# Patient Record
Sex: Female | Born: 1980
Health system: Southern US, Community
[De-identification: ages and names within clinical notes are randomized; demographics above are authoritative.]

## PROBLEM LIST (undated history)

## (undated) DIAGNOSIS — E282 Polycystic ovarian syndrome: Secondary | ICD-10-CM

## (undated) DIAGNOSIS — F419 Anxiety disorder, unspecified: Secondary | ICD-10-CM

## (undated) DIAGNOSIS — Z720 Tobacco use: Secondary | ICD-10-CM

## (undated) DIAGNOSIS — Z87898 Personal history of other specified conditions: Secondary | ICD-10-CM

## (undated) DIAGNOSIS — F32A Depression, unspecified: Secondary | ICD-10-CM

## (undated) DIAGNOSIS — R569 Unspecified convulsions: Secondary | ICD-10-CM

## (undated) DIAGNOSIS — B354 Tinea corporis: Secondary | ICD-10-CM

## (undated) DIAGNOSIS — F1911 Other psychoactive substance abuse, in remission: Secondary | ICD-10-CM

## (undated) DIAGNOSIS — F329 Major depressive disorder, single episode, unspecified: Secondary | ICD-10-CM

## (undated) HISTORY — PX: DILATION AND CURETTAGE OF UTERUS: SHX78

## (undated) HISTORY — PX: TONSILLECTOMY: SUR1361

---

## 2005-12-19 ENCOUNTER — Ambulatory Visit: Payer: Self-pay | Admitting: Otolaryngology

## 2007-01-07 ENCOUNTER — Emergency Department: Payer: Self-pay | Admitting: Emergency Medicine

## 2007-04-18 ENCOUNTER — Ambulatory Visit: Payer: Self-pay | Admitting: Unknown Physician Specialty

## 2007-04-21 ENCOUNTER — Inpatient Hospital Stay: Payer: Self-pay | Admitting: Unknown Physician Specialty

## 2007-05-19 ENCOUNTER — Ambulatory Visit: Payer: Self-pay | Admitting: Unknown Physician Specialty

## 2007-06-18 ENCOUNTER — Ambulatory Visit: Payer: Self-pay | Admitting: Unknown Physician Specialty

## 2007-07-19 ENCOUNTER — Ambulatory Visit: Payer: Self-pay | Admitting: Unknown Physician Specialty

## 2011-08-31 ENCOUNTER — Ambulatory Visit: Payer: Self-pay | Admitting: Neurology

## 2014-11-25 DIAGNOSIS — L700 Acne vulgaris: Secondary | ICD-10-CM | POA: Insufficient documentation

## 2014-11-25 DIAGNOSIS — F411 Generalized anxiety disorder: Secondary | ICD-10-CM | POA: Insufficient documentation

## 2014-11-25 DIAGNOSIS — F5101 Primary insomnia: Secondary | ICD-10-CM | POA: Insufficient documentation

## 2015-01-22 ENCOUNTER — Observation Stay: Payer: Self-pay | Admitting: Obstetrics & Gynecology

## 2015-01-22 LAB — CBC WITH DIFFERENTIAL/PLATELET
Basophil #: 0.1 10*3/uL (ref 0.0–0.1)
Basophil #: 0.1 10*3/uL (ref 0.0–0.1)
Basophil %: 0.7 %
Basophil %: 0.3 %
Eosinophil #: 0.1 10*3/uL (ref 0.0–0.7)
Eosinophil #: 0.1 10*3/uL (ref 0.0–0.7)
Eosinophil %: 0.3 %
Eosinophil %: 0.8 %
HCT: 42.4 % (ref 35.0–47.0)
HCT: 35.5 % (ref 35.0–47.0)
HGB: 14.1 g/dL (ref 12.0–16.0)
HGB: 11.9 g/dL — ABNORMAL LOW (ref 12.0–16.0)
Lymphocyte #: 1.8 10*3/uL (ref 1.0–3.6)
Lymphocyte #: 3.4 10*3/uL (ref 1.0–3.6)
Lymphocyte %: 9.7 %
Lymphocyte %: 17.8 %
MCH: 30.2 pg (ref 26.0–34.0)
MCH: 30.4 pg (ref 26.0–34.0)
MCHC: 33.4 g/dL (ref 32.0–36.0)
MCHC: 33.6 g/dL (ref 32.0–36.0)
MCV: 90 fL (ref 80–100)
MCV: 90 fL (ref 80–100)
Monocyte #: 0.9 x10 3/mm (ref 0.2–0.9)
Monocyte #: 1.3 x10 3/mm — ABNORMAL HIGH (ref 0.2–0.9)
Monocyte %: 4.9 %
Monocyte %: 6.9 %
Neutrophil #: 16.1 10*3/uL — ABNORMAL HIGH (ref 1.4–6.5)
Neutrophil #: 14.3 10*3/uL — ABNORMAL HIGH (ref 1.4–6.5)
Neutrophil %: 84.4 %
Neutrophil %: 74.2 %
Platelet: 254 10*3/uL (ref 150–440)
Platelet: 223 10*3/uL (ref 150–440)
RBC: 4.69 10*6/uL (ref 3.80–5.20)
RBC: 3.93 10*6/uL (ref 3.80–5.20)
RDW: 12.2 % (ref 11.5–14.5)
RDW: 12.2 % (ref 11.5–14.5)
WBC: 19 10*3/uL — ABNORMAL HIGH (ref 3.6–11.0)
WBC: 19.3 10*3/uL — ABNORMAL HIGH (ref 3.6–11.0)

## 2015-01-22 LAB — URINALYSIS, COMPLETE
Bacteria: NONE SEEN
Bilirubin,UR: NEGATIVE
Glucose,UR: 50 mg/dL (ref 0–75)
Leukocyte Esterase: NEGATIVE
Nitrite: NEGATIVE
Ph: 6 (ref 4.5–8.0)
Protein: NEGATIVE
RBC,UR: 4 /HPF (ref 0–5)
Specific Gravity: 1.019 (ref 1.003–1.030)
Squamous Epithelial: 1
WBC UR: 1 /HPF (ref 0–5)

## 2015-01-22 LAB — LIPASE, BLOOD: Lipase: 80 U/L (ref 73–393)

## 2015-01-22 LAB — COMPREHENSIVE METABOLIC PANEL
Albumin: 3.6 g/dL (ref 3.4–5.0)
Alkaline Phosphatase: 100 U/L (ref 46–116)
Anion Gap: 8 (ref 7–16)
BUN: 8 mg/dL (ref 7–18)
Bilirubin,Total: 1 mg/dL (ref 0.2–1.0)
Calcium, Total: 8.9 mg/dL (ref 8.5–10.1)
Chloride: 101 mmol/L (ref 98–107)
Co2: 28 mmol/L (ref 21–32)
Creatinine: 0.84 mg/dL (ref 0.60–1.30)
EGFR (African American): >60
EGFR (Non-African Amer.): >60
Glucose: 146 mg/dL — ABNORMAL HIGH (ref 65–99)
Osmolality: 275 (ref 275–301)
Potassium: 3.4 mmol/L — ABNORMAL LOW (ref 3.5–5.1)
SGOT(AST): 28 U/L (ref 15–37)
SGPT (ALT): 24 U/L (ref 14–63)
Sodium: 137 mmol/L (ref 136–145)
Total Protein: 7.5 g/dL (ref 6.4–8.2)

## 2015-01-22 LAB — HCG, QUANTITATIVE, PREGNANCY: Beta Hcg, Quant.: 870 m[IU]/mL — ABNORMAL HIGH

## 2015-01-23 LAB — CBC WITH DIFFERENTIAL/PLATELET
Basophil #: 0.1 10*3/uL (ref 0.0–0.1)
Basophil %: 0.4 %
Eosinophil #: 0.3 10*3/uL (ref 0.0–0.7)
Eosinophil %: 1.6 %
HCT: 31.1 % — ABNORMAL LOW (ref 35.0–47.0)
HGB: 10.8 g/dL — ABNORMAL LOW (ref 12.0–16.0)
Lymphocyte #: 3.9 10*3/uL — ABNORMAL HIGH (ref 1.0–3.6)
Lymphocyte %: 22.2 %
MCH: 31.6 pg (ref 26.0–34.0)
MCHC: 34.6 g/dL (ref 32.0–36.0)
MCV: 91 fL (ref 80–100)
Monocyte #: 1.1 x10 3/mm — ABNORMAL HIGH (ref 0.2–0.9)
Monocyte %: 6.5 %
Neutrophil #: 12.1 10*3/uL — ABNORMAL HIGH (ref 1.4–6.5)
Neutrophil %: 69.3 %
Platelet: 193 10*3/uL (ref 150–440)
RBC: 3.41 10*6/uL — ABNORMAL LOW (ref 3.80–5.20)
RDW: 12.1 % (ref 11.5–14.5)
WBC: 17.5 10*3/uL — ABNORMAL HIGH (ref 3.6–11.0)

## 2015-04-12 LAB — SURGICAL PATHOLOGY

## 2015-04-18 NOTE — Op Note (Signed)
PATIENT NAME:  Darrel ReachRAY, Kamya MR#:  [id number] DATE OF BIRTH:  02-10-1981  DATE OF PROCEDURE:  01/23/2015  PREOPERATIVE DIAGNOSIS: Incomplete abortion.   POSTOPERATIVE DIAGNOSIS: Incomplete abortion.   PROCEDURE: Suction dilatation and curettage.   SURGEON: Dierdre Searles. Paul Harris, M.D.   ANESTHESIA: General.   ESTIMATED BLOOD LOSS: Minimal.   COMPLICATIONS: None.   FINDINGS AND SPECIMENS: Products of conception.   DISPOSITION: To recovery room stable.   TECHNIQUE: The patient is prepped and draped in the usual sterile fashion after adequate anesthesia is obtained in the dorsal lithotomy position. Bladder is drained with a Robinson catheter. Speculum is placed and the anterior lip of the cervix is grasped with a tenaculum. Using a grasping forceps, some tissue is removed from the cervix and lower uterine segment consistent with history and ultrasound findings. Aspiration of products of conception using an 8 mm rigid curette is performed under suction. A copious amount of tissue was obtained, both through suction as well as banjo curetting. A gentle curettage is performed. Repeat aspiration is performed. The patient is deemed to have complete curettage of her intrauterine cavity. The tenaculum is removed with hemostasis noted. The patient goes to recovery room in stable condition. All sponge, instrument, and needle counts are correct    ____________________________ R. Annamarie MajorPaul Harris, MD rph:at D: 01/23/2015 14:21:19 ET T: 01/23/2015 16:16:52 ET JOB#: [id number]  cc: Dierdre Searles. Paul Harris, MD, <Dictator> Nadara MustardOBERT P HARRIS MD ELECTRONICALLY SIGNED 01/28/2015 2:45

## 2015-12-19 NOTE — L&D Delivery Note (Signed)
Delivery Note At 6:02 AM a pre-viable apparent female was delivered via Vaginal, Spontaneous Delivery (Presentation: cephalic).  APGAR: Not assigned.   Placenta status: Spontaneous, intact.  Cord: 3 vessels.  with the following complications: None.   Anesthesia:  Epidural Episiotomy: None Lacerations: None Suture Repair: n/a Est. Blood Loss (mL): 300  Called to see patient.  Mom pushed to deliver a pre-viable apparent female infant.  The head followed by shoulders, which delivered without difficulty, and the rest of the body.  Cord clamped and cut.  No cord blood obtained.  Placenta delivered spontaneously, intact, with a 3-vessel cord. No cervical, vaginal, or perineal laceration noted.  All counts correct.  Hemostasis obtained with IV pitocin and fundal massage. EBL 300 mL.    Conard NovakJackson, Caly Pellum D 09/15/2016, 6:26 AM

## 2016-03-18 ENCOUNTER — Encounter: Payer: Self-pay | Admitting: *Deleted

## 2016-03-18 ENCOUNTER — Emergency Department: Payer: 59

## 2016-03-18 ENCOUNTER — Observation Stay
Admission: EM | Admit: 2016-03-18 | Discharge: 2016-03-19 | Disposition: A | Discharge status: Home / Self Care | Payer: 59 | Attending: Obstetrics and Gynecology | Admitting: Obstetrics and Gynecology

## 2016-03-18 DIAGNOSIS — O209 Hemorrhage in early pregnancy, unspecified: Secondary | ICD-10-CM | POA: Insufficient documentation

## 2016-03-18 DIAGNOSIS — G40909 Epilepsy, unspecified, not intractable, without status epilepticus: Secondary | ICD-10-CM | POA: Insufficient documentation

## 2016-03-18 DIAGNOSIS — Z3A08 8 weeks gestation of pregnancy: Secondary | ICD-10-CM | POA: Insufficient documentation

## 2016-03-18 DIAGNOSIS — O034 Incomplete spontaneous abortion without complication: Principal | ICD-10-CM | POA: Insufficient documentation

## 2016-03-18 DIAGNOSIS — Z87891 Personal history of nicotine dependence: Secondary | ICD-10-CM | POA: Insufficient documentation

## 2016-03-18 DIAGNOSIS — F419 Anxiety disorder, unspecified: Secondary | ICD-10-CM | POA: Insufficient documentation

## 2016-03-18 DIAGNOSIS — F329 Major depressive disorder, single episode, unspecified: Secondary | ICD-10-CM | POA: Insufficient documentation

## 2016-03-18 DIAGNOSIS — O039 Complete or unspecified spontaneous abortion without complication: Secondary | ICD-10-CM

## 2016-03-18 HISTORY — DX: Polycystic ovarian syndrome: E28.2

## 2016-03-18 HISTORY — DX: Depression, unspecified: F32.A

## 2016-03-18 HISTORY — DX: Major depressive disorder, single episode, unspecified: F32.9

## 2016-03-18 HISTORY — DX: Tobacco use: Z72.0

## 2016-03-18 HISTORY — DX: Personal history of other specified conditions: Z87.898

## 2016-03-18 HISTORY — DX: Other psychoactive substance abuse, in remission: F19.11

## 2016-03-18 HISTORY — DX: Anxiety disorder, unspecified: F41.9

## 2016-03-18 LAB — CBC
HCT: 40.2 % (ref 35.0–47.0)
Hemoglobin: 13.6 g/dL (ref 12.0–16.0)
MCH: 30.7 pg (ref 26.0–34.0)
MCHC: 33.9 g/dL (ref 32.0–36.0)
MCV: 90.5 fL (ref 80.0–100.0)
Platelets: 258 10*3/uL (ref 150–440)
RBC: 4.44 MIL/uL (ref 3.80–5.20)
RDW: 12.9 % (ref 11.5–14.5)
WBC: 20.7 10*3/uL — ABNORMAL HIGH (ref 3.6–11.0)

## 2016-03-18 LAB — TYPE AND SCREEN
ABO/RH(D): O POS
Antibody Screen: NEGATIVE

## 2016-03-18 LAB — ABO/RH: ABO/RH(D): O POS

## 2016-03-18 LAB — HCG, QUANTITATIVE, PREGNANCY: hCG, Beta Chain, Quant, S: 1279 m[IU]/mL — ABNORMAL HIGH (ref <5)

## 2016-03-18 MED ORDER — SODIUM CHLORIDE 0.9 % IV SOLN
1000.0000 mL | Freq: Once | INTRAVENOUS | Status: AC
  Administered 2016-03-19: 1000 mL via INTRAVENOUS

## 2016-03-18 MED ORDER — ONDANSETRON HCL 4 MG/2ML IJ SOLN
4.0000 mg | Freq: Once | INTRAMUSCULAR | Status: AC
  Administered 2016-03-18: 4 mg via INTRAVENOUS
  Filled 2016-03-18: qty 2

## 2016-03-18 MED ORDER — MORPHINE SULFATE (PF) 4 MG/ML IV SOLN
4.0000 mg | Freq: Once | INTRAVENOUS | Status: AC
  Administered 2016-03-18: 4 mg via INTRAVENOUS
  Filled 2016-03-18: qty 1

## 2016-03-18 NOTE — ED Notes (Signed)
[facility]

## 2016-03-18 NOTE — ED Notes (Signed)
Provided pt with ice water per EDP approval and pt request.

## 2016-03-18 NOTE — ED Notes (Signed)
Pt states vaginal bleeding and cramping for 5 hours, states nausea and lightheaded, states she is unsure if she is pregnant, states she is passing clots and tissues, states hx of miscarriages

## 2016-03-18 NOTE — ED Provider Notes (Signed)
[facility] Emergency Department Provider Note  ____________________________________________    I have reviewed the triage vital signs and the nursing notes.   HISTORY  Chief Complaint Vaginal Bleeding    HPI Darrel Reachshley Schoppe is a 35 y.o. female who presents with painful pelvic cramping and vaginal bleeding which reportedly started today. She does not know her  last menstrual period as she has irregular periods. She denies dizziness. No nausea or vomiting. She does not know she is pregnant but she suspects she might be. She does have a history of a miscarriage in the past. She does report passing clots.     History reviewed. No pertinent past medical history.  There are no active problems to display for this patient.   No past surgical history on file.  No current outpatient prescriptions on file.  Allergies Sulfa antibiotics  History reviewed. No pertinent family history.  Social History Social History  Substance Use Topics  . Smoking status: None  . Smokeless tobacco: None  . Alcohol Use: None  Mother reports patient has a history of drug abuse/addiction Nonsmoker  Review of Systems  Constitutional: Negative for dizziness Eyes: Negative for redness ENT: Negative for sore throat Cardiovascular: Negative for palpitations Respiratory: Negative for shortness of breath. Gastrointestinal: Negative for abdominal pain Genitourinary: Negative for dysuria. Vaginal bleeding as above Musculoskeletal: Negative for back pain. Skin: Negative for rash. Neurological: Negative for focal weakness Psychiatric: no anxiety    ____________________________________________   PHYSICAL EXAM:  VITAL SIGNS: ED Triage Vitals  Enc Vitals Group     BP 03/18/16 1744 103/57 mmHg     Pulse Rate 03/18/16 1744 59     Resp 03/18/16 1744 18     Temp 03/18/16 1744 98.3 F (36.8 C)     Temp Source 03/18/16 1744 Oral     SpO2 03/18/16 1744 100 %     Weight 03/18/16  1744 160 lb (72.576 kg)     Height 03/18/16 1744 5\' 5"  (1.651 m)     Head Cir --      Peak Flow --      Pain Score 03/18/16 1744 10     Pain Loc --      Pain Edu? --      Excl. in GC? --    Constitutional: Alert and oriented. No acute distress but anxious Eyes: Conjunctivae are normal. No erythema or injection ENT   Head: Normocephalic and atraumatic.   Mouth/Throat: Mucous membranes are moist. Cardiovascular: Normal rate, regular rhythm. Normal and symmetric distal pulses are present in the upper extremities. No murmurs or rubs  Respiratory: Normal respiratory effort without tachypnea nor retractions. Breath sounds are clear and equal bilaterally.  Gastrointestinal: Soft and non-tender in all quadrants. No distention. There is no CVA tenderness. Genitourinary: deferred Musculoskeletal: Nontender with normal range of motion in all extremities.  Neurologic:  Normal speech and language. No gross focal neurologic deficits are appreciated. Skin:  Skin is warm, dry and intact. No rash noted.   ____________________________________________    LABS (pertinent positives/negatives)  Labs Reviewed  HCG, QUANTITATIVE, PREGNANCY - Abnormal; Notable for the following:    hCG, Beta Chain, Quant, S 1279 (*)    All other components within normal limits  CBC - Abnormal; Notable for the following:    WBC 20.7 (*)    All other components within normal limits  ABO/RH  TYPE AND SCREEN    ____________________________________________   EKG  None  ____________________________________________    RADIOLOGY  Ultrasound pending  ____________________________________________   PROCEDURES  Procedure(s) performed: none  Critical Care performed: none  ____________________________________________   INITIAL IMPRESSION / ASSESSMENT AND PLAN / ED COURSE  Pertinent labs & imaging results that were available during my care of the patient were reviewed by me and considered in my  medical decision making (see chart for details).  Patient with beta 1500, she is in considerable pain we'll give IV morphine and Zofran. Ultrasound pending, hemoglobin normal, pulse normal   Patient refused to allow ultrasound tech to do transvaginal ultrasound. This was not communicated to me. I discussed with the patient how important it is to get accurate study and she agrees to do transvaginal study now.  Tech informed me that second ultrasound was also limited. She continues to complain of severe pain and is vomiting  I paged Dr. Vergie Living of GYN to discuss the case with him. He has requested a repeat CBC, normal saline and he will see the patient in the ED _______________________  _____________________   FINAL CLINICAL IMPRESSION(S) / ED DIAGNOSES  Final diagnoses:  Miscarriage          Jene Every, MD 03/18/16 2345

## 2016-03-18 NOTE — ED Notes (Signed)
Patient transported to Ultrasound 

## 2016-03-18 NOTE — ED Notes (Signed)
RN called into patients room, Patient up and in restroom trying to give urine sample. Patient passed large amount of blood while in the restroom. Patient placed backed in the bed. Patient reports some lightheadedness

## 2016-03-18 NOTE — ED Notes (Signed)
Pt c/o pain.  EDP notified.  Wants to wait for results of US.  Patient and patient's mother notified.

## 2016-03-18 NOTE — ED Notes (Signed)
Provided patient pads and introduced self to patient and mom at bedside.  No other needs at this time.

## 2016-03-18 NOTE — ED Notes (Addendum)
Patient states that she had had vaginal bleeding and cramping for about 5 hours. Patient is unsure of her LMP. Patient states that she had used 4-5 pads since the bleeding started. Patient states that she had also been feeling nauseated for the past 2 hours.

## 2016-03-19 ENCOUNTER — Observation Stay: Payer: 59 | Admitting: Registered Nurse

## 2016-03-19 ENCOUNTER — Encounter
Admission: EM | Disposition: A | Discharge status: Home / Self Care | Payer: Self-pay | Source: Home / Self Care | Attending: Emergency Medicine

## 2016-03-19 ENCOUNTER — Encounter: Payer: Self-pay | Admitting: Obstetrics and Gynecology

## 2016-03-19 DIAGNOSIS — O034 Incomplete spontaneous abortion without complication: Secondary | ICD-10-CM | POA: Diagnosis present

## 2016-03-19 HISTORY — PX: DILATION AND CURETTAGE OF UTERUS: SHX78

## 2016-03-19 LAB — URINE DRUG SCREEN, QUALITATIVE (ARMC ONLY)
Amphetamines, Ur Screen: NOT DETECTED
Barbiturates, Ur Screen: NOT DETECTED
Benzodiazepine, Ur Scrn: NOT DETECTED
Cannabinoid 50 Ng, Ur ~~LOC~~: NOT DETECTED
Cocaine Metabolite,Ur ~~LOC~~: NOT DETECTED
MDMA (Ecstasy)Ur Screen: NOT DETECTED
Methadone Scn, Ur: NOT DETECTED
Opiate, Ur Screen: POSITIVE — AB
Phencyclidine (PCP) Ur S: NOT DETECTED
Tricyclic, Ur Screen: NOT DETECTED

## 2016-03-19 LAB — CBC
HCT: 35.4 % (ref 35.0–47.0)
Hemoglobin: 12 g/dL (ref 12.0–16.0)
MCH: 30.8 pg (ref 26.0–34.0)
MCHC: 33.8 g/dL (ref 32.0–36.0)
MCV: 91.3 fL (ref 80.0–100.0)
Platelets: 248 10*3/uL (ref 150–440)
RBC: 3.88 MIL/uL (ref 3.80–5.20)
RDW: 13 % (ref 11.5–14.5)
WBC: 24.4 10*3/uL — ABNORMAL HIGH (ref 3.6–11.0)

## 2016-03-19 LAB — CHLAMYDIA/NGC RT PCR (ARMC ONLY)
Chlamydia Tr: NOT DETECTED
N gonorrhoeae: NOT DETECTED

## 2016-03-19 SURGERY — DILATION AND CURETTAGE
Anesthesia: General | Wound class: Clean Contaminated

## 2016-03-19 SURGERY — DILATION AND EVACUATION, UTERUS
Anesthesia: Choice

## 2016-03-19 MED ORDER — METHYLERGONOVINE MALEATE 0.2 MG/ML IJ SOLN
0.2000 mg | Freq: Once | INTRAMUSCULAR | Status: AC
Start: 2016-03-19 — End: 2016-03-19
  Administered 2016-03-19: 0.2 mg via INTRAMUSCULAR

## 2016-03-19 MED ORDER — METHYLERGONOVINE MALEATE 0.2 MG/ML IJ SOLN
INTRAMUSCULAR | Status: AC
  Filled 2016-03-19: qty 1

## 2016-03-19 MED ORDER — ONDANSETRON HCL 4 MG/2ML IJ SOLN: 4.0000 mg | Freq: Once | INTRAMUSCULAR | Status: DC | PRN

## 2016-03-19 MED ORDER — MORPHINE SULFATE (PF) 4 MG/ML IV SOLN
4.0000 mg | Freq: Once | INTRAVENOUS | Status: AC
  Administered 2016-03-19: 4 mg via INTRAVENOUS
  Filled 2016-03-19: qty 1

## 2016-03-19 MED ORDER — HYDROMORPHONE HCL 1 MG/ML IJ SOLN: 1.0000 mg | INTRAMUSCULAR | Status: DC | PRN

## 2016-03-19 MED ORDER — IBUPROFEN 200 MG PO TABS: 600.0000 mg | ORAL_TABLET | Freq: Four times a day (QID) | ORAL | Status: DC | PRN

## 2016-03-19 MED ORDER — FENTANYL CITRATE (PF) 100 MCG/2ML IJ SOLN
INTRAMUSCULAR | Status: DC | PRN
  Administered 2016-03-19: 100 ug via INTRAVENOUS

## 2016-03-19 MED ORDER — SILVER NITRATE-POT NITRATE 75-25 % EX MISC
CUTANEOUS | Status: DC | PRN
  Administered 2016-03-19: 4 via TOPICAL

## 2016-03-19 MED ORDER — FENTANYL CITRATE (PF) 100 MCG/2ML IJ SOLN: 25.0000 ug | INTRAMUSCULAR | Status: DC | PRN

## 2016-03-19 MED ORDER — METOCLOPRAMIDE HCL 5 MG/ML IJ SOLN: 10.0000 mg | Freq: Four times a day (QID) | INTRAMUSCULAR | Status: DC | PRN

## 2016-03-19 MED ORDER — METHYLERGONOVINE MALEATE 0.2 MG/ML IJ SOLN: 0.2000 mg | Freq: Once | INTRAMUSCULAR | Status: DC

## 2016-03-19 MED ORDER — ONDANSETRON HCL 4 MG/2ML IJ SOLN
4.0000 mg | Freq: Once | INTRAMUSCULAR | Status: AC
  Administered 2016-03-19: 4 mg via INTRAVENOUS
  Filled 2016-03-19: qty 2

## 2016-03-19 MED ORDER — LACTATED RINGERS IV SOLN
INTRAVENOUS | Status: DC | PRN
  Administered 2016-03-19: 02:00:00 via INTRAVENOUS

## 2016-03-19 MED ORDER — SODIUM CHLORIDE 0.9 % IV SOLN: INTRAVENOUS | Status: DC

## 2016-03-19 MED ORDER — DEXTROSE 5 % IV SOLN
100.0000 mg | Freq: Once | INTRAVENOUS | Status: DC
  Filled 2016-03-19: qty 100

## 2016-03-19 MED ORDER — PROPOFOL 10 MG/ML IV BOLUS
INTRAVENOUS | Status: DC | PRN
  Administered 2016-03-19: 160 mg via INTRAVENOUS

## 2016-03-19 MED ORDER — METHYLERGONOVINE MALEATE 0.2 MG PO TABS: 0.2000 mg | ORAL_TABLET | Freq: Four times a day (QID) | ORAL | Status: DC

## 2016-03-19 MED ORDER — SUCCINYLCHOLINE CHLORIDE 20 MG/ML IJ SOLN
INTRAMUSCULAR | Status: DC | PRN
  Administered 2016-03-19: 100 mg via INTRAVENOUS

## 2016-03-19 MED ORDER — HYDROMORPHONE HCL 1 MG/ML IJ SOLN: 0.2500 mg | INTRAMUSCULAR | Status: DC | PRN

## 2016-03-19 MED ORDER — ACETAMINOPHEN-CODEINE #3 300-30 MG PO TABS: 1.0000 | ORAL_TABLET | Freq: Three times a day (TID) | ORAL | Status: DC | PRN

## 2016-03-19 SURGICAL SUPPLY — 17 items
CATH ROBINSON RED A/P 16FR (CATHETERS) ×2 IMPLANT
GLOVE BIO SURGEON STRL SZ7 (GLOVE) ×2 IMPLANT
GLOVE INDICATOR 7.5 STRL GRN (GLOVE) ×2 IMPLANT
GOWN STRL REUS W/ TWL LRG LVL3 (GOWN DISPOSABLE) ×1 IMPLANT
GOWN STRL REUS W/ TWL XL LVL3 (GOWN DISPOSABLE) ×1 IMPLANT
GOWN STRL REUS W/TWL LRG LVL3 (GOWN DISPOSABLE) ×1
GOWN STRL REUS W/TWL XL LVL3 (GOWN DISPOSABLE) ×1
KIT RM TURNOVER CYSTO AR (KITS) ×2 IMPLANT
NEEDLE SPNL 22GX3.5 QUINCKE BK (NEEDLE) IMPLANT
PACK DNC HYST (MISCELLANEOUS) ×2 IMPLANT
PAD OB MATERNITY 4.3X12.25 (PERSONAL CARE ITEMS) ×2 IMPLANT
PAD PREP 24X41 OB/GYN DISP (PERSONAL CARE ITEMS) ×2 IMPLANT
SET BERKELEY SUCTION TUBING (SUCTIONS) ×2 IMPLANT
SYR CONTROL 10ML (SYRINGE) IMPLANT
TOWEL OR 17X26 4PK STRL BLUE (TOWEL DISPOSABLE) ×2 IMPLANT
VACURETTE 10 RIGID CVD (CANNULA) ×2 IMPLANT
VACURETTE 8 RIGID CVD (CANNULA) ×2 IMPLANT

## 2016-03-19 NOTE — Consult Note (Addendum)
Obstetrics & Gynecology Consultation Note  Date of Consultation: 03/19/2016   Requesting Provider: [facility];  Dr. Cyril LoosenKinner  [facility] Primary Care Provider: Leighton RuffKLINE,Ashley  Reason for Consultation: VB, ?SAB in progress  History of Present Illness: [name] is a 35 y.o. G2P0010 (LMP: unknown, ?Jan 2017), with the above CC. PMHx is significant for h/o similar situation in 01/2015 and need for D&C, remote h/o seizure disorder, tobacco abuse, BMI 27, irregular periods (?PCOS).  Patient started having slight VB and cramping yesterday that has intensified today so she came in for evaluation. Pt unsure of how much but passing large clots. Patient with long history of irregular periods. Last OCP use was in December 2016 and ?period in January of this year. She didn't know she was pregnant until she came to the [facility] for evaluation. No fevers, chills, chest pain, SOB. +nausea and vomiting. Last PO was breakfast yesterday.    ROS: A 12-point review of systems was performed and negative, except as stated in the above HPI.  [facility] History: As per HPI. OB History    Gravida Para Term Preterm AB TAB SAB Ectopic Multiple Living   2    1  1          Obstetric Comments   01/2015: SAB, D&C after failed medical management. Dr Ashley [facility], Ashley [facility]     History of abnormal pap smears: Yes. Last pap smear 2013 History of STIs: No She is currently using nothing for contraception.    Past Medical History: Past Medical History  Diagnosis Date  . History of seizures   . Tobacco abuse   . Polycystic disease, ovaries   . History of substance abuse     Past Surgical History: Past Surgical History  Procedure Laterality Date  . Tonsillectomy    . Dilation and curettage of uterus      Family History:  History reviewed. No pertinent family history.  Social History:  Social History   Social History  . Marital Status: Single    Spouse Name: N/A  . Number of Children: N/A  . Years of  Education: N/A   Occupational History  . Not on file.   Social History Main Topics  . Smoking status: Not on file  . Smokeless tobacco: Not on file  . Alcohol Use: Not on file  . Drug Use: Not on file  . Sexual Activity: Not on file   Other Topics Concern  . Not on file   Social History Narrative  She smokes about a pack q 2-3d  Allergy: Allergies  Allergen Reactions  . Sulfa Antibiotics Hives  . Paroxetine Hcl     Other reaction(s): Other (See Comments)    Current Outpatient Medications:  No current facility-administered medications for this encounter.   Current Outpatient Prescriptions  Medication Sig Dispense Refill  . buPROPion (WELLBUTRIN XL) 150 MG 24 hr tablet Take 150 mg by mouth every morning.  5  . traZODone (DESYREL) 50 MG tablet Take 50 mg by mouth at bedtime as needed. For sleep.  5     Physical Exam: Filed Vitals:   03/18/16 2100 03/18/16 2130 03/18/16 2200 03/19/16 0007  BP: 91/54 99/67 105/62 114/71  Pulse: 51 47 65 56  Temp:      TempSrc:      Resp: 18  18 18   Height:      Weight:      SpO2: 97% 98% 97% 99%   Repeat temp just know was 98  Current Vital Signs 24h Vital Sign Ranges  T 98.3 F (36.8 C) Temp  Avg: 98.3 F (36.8 C)  Min: 98.3 F (36.8 C)  Max: 98.3 F (36.8 C)  BP 114/71 mmHg BP  Min: 91/54  Max: 117/62  HR (!) 56 Pulse  Avg: 58  Min: 47  Max: 65  RR 18 Resp  Avg: 18.7  Min: 18  Max: 22  SaO2 99 % Not Delivered SpO2  Avg: 98.7 %  Min: 97 %  Max: 100 %       24 Hour I/O Current Shift I/O  Time Ins Outs       Body mass index is 26.63 kg/(m^2). General appearance: Well nourished, well developed female in no acute distress.  Neck:  Supple, normal appearance, and no thyromegaly  Cardiovascular:Normal s1 and s2, HR in the 60s Respiratory:  Clear to auscultation bilateral. Normal respiratory effort Abdomen: +BS, soft, nttp, nd.  Neuro/Psych:  Normal mood and affect.  Skin:  Warm and dry.  Lymphatic:  No inguinal  lymphadenopathy.   Pelvic exam: deferred. No gross VB on pad of chus  Laboratory: Beta HCG: 1279 @ 1745  Recent Labs Lab 03/18/16 1746 03/18/16 2356  WBC 20.7* 24.4*  HGB 13.6 12.0  HCT 40.2 35.4  PLT 258 248   Recent Labs Lab 03/18/16 1746  ABORH O POS  O POS    Imaging:  EXAM: TRANSVAGINAL OB ULTRASOUND  TECHNIQUE: Transvaginal ultrasound was performed for complete evaluation of the gestation as well as the maternal uterus, adnexal regions, and pelvic cul-de-sac.  COMPARISON: Pelvic ultrasound performed earlier today at 7:29 p.m.  FINDINGS: Intrauterine gestational sac: Irregular gestational sac noted, extending inferiorly along the lower uterine segment.  Yolk sac: No  Embryo: Yes  Cardiac Activity: The patient refused the remainder of the exam.  CRL: 1.85 cm 8 w 3 d [place] EDC: 10/25/2016  Subchorionic hemorrhage: None visualized.  Maternal uterus/adnexae: The patient refused the remainder of the exam.  IMPRESSION: There is a diffusely irregular gestational sac extending inferiorly along the lower uterine segment. The embryo has a crown-rump length of 1.9 cm, corresponding to a gestational age of [redacted] weeks 3 days. The embryo's cardiac activity is not assessed, as the patient refused the remainder of the exam.  However, the location of the gestational sac is suspicious for spontaneous abortion in progress. On correlation with the transabdominal ultrasound performed earlier this evening, the location of the gestational sac is relatively stable since that study.   Electronically Signed  By: [facility] M.D.  On: 03/18/2016 23:27   CLINICAL DATA: Severe cramping and bleeding for 1 day. Patient unsure of last menstrual. Beta HCG 1,279.  EXAM: OBSTETRIC <14 WK ULTRASOUND  TECHNIQUE: Transabdominal ultrasound was performed for evaluation of the gestation as well as the maternal uterus and adnexal  regions.  COMPARISON: 01/23/2015  FINDINGS: Intrauterine gestational sac: Small volume endometrial fluid, which could represent an injury uterine gestational sac. Suboptimally evaluated secondary to the transabdominal technique. Uterine heterogeneity.  Yolk sac: Absent  Embryo: Absent  Cardiac Activity: Absent  Maternal uterus/adnexae: Heterogeneity throughout the endometrium.  Right ovary measures 2.7 x 3.2 x 2.0 cm and demonstrates a 1.4 cm hypoechoic lesion within, possibly a corpus luteal cyst.  Left ovary is normal, measuring 3.4 x 1.9 x 1.8 cm.  No significant free fluid.  Suboptimal exam, secondary to patient recent voiding and limited mobility secondary to pain. The patient refused transvaginal scanning.  IMPRESSION: 1. Limited exam, as detailed  above. The patient refused transvaginal scanning. 2. Small right ovarian hypoechoic lesion is likely a corpus luteal cyst. 3. Heterogeneity within the endometrium with small amount of endometrial fluid. This could represent an early intrauterine gestational sac, but is suboptimally characterized. Consider short term follow-up with beta HCG and possible ultrasound (ideally with transvaginal component).   Electronically Signed  By: Jeronimo Greaves M.D.  On: 03/18/2016 20:37    Bedside TAUS done and ES 19mm, no flow and looks like blood products but possible rPOCs  Assessment: pt stable  Plan: Given the amount of pain she's in, I told her that I recommend D&C. R/b/a d/w pt and she is amenable to this. WBC elevated but no peritoneal s/s, negative VS and negative exam with patient just endorsing cramping. Minimal concern for septic AB since she looks so well but given picture, another reason to proceed to OR. Can proceed when OR is ready. Pt and mom told that if everything goes well that she should be fine for d/c to home later after the procedure. UDS ordered. Of note, in Care Everywhere it states that  she's had a BTL; the patient confirms that she has NOT had a BTL. Given most c/w IUP and SAB in progress, no need to call pathology in for frozen and can follow up final pathology and trend betas if villi aren't seen.   Total time taking care of the patient was 30 minutes, with greater than 50% of the time spent in face to face interaction with the patient.  Cornelia Copa MD [facility] Pager [phone]

## 2016-03-19 NOTE — Anesthesia Procedure Notes (Signed)
Procedure Name: Intubation Date/Time: 03/19/2016 2:00 AM Performed by: [id number], Knute Mazzuca Pre-anesthesia Checklist: Emergency Drugs available, Suction available, Timeout performed, Patient being monitored and Patient identified Patient Re-evaluated:Patient Re-evaluated prior to inductionOxygen Delivery Method: Circle system utilized Preoxygenation: Pre-oxygenation with 100% oxygen Intubation Type: IV induction and Rapid sequence Number of attempts: 1 Placement Confirmation: breath sounds checked- equal and bilateral,  CO2 detector,  positive ETCO2 and ETT inserted through vocal cords under direct vision Secured at: 21 cm Tube secured with: Tape

## 2016-03-19 NOTE — Anesthesia Preprocedure Evaluation (Addendum)
Anesthesia Evaluation  Patient identified by MRN, date of birth, ID band Patient awake    Reviewed: Allergy & Precautions, H&P , NPO status , Patient's Chart, lab work & pertinent test results, reviewed documented beta blocker date and time   Airway Mallampati: II  TM Distance: >3 FB Neck ROM: full    Dental  (+) Teeth Intact   Pulmonary neg pulmonary ROS,    Pulmonary exam normal        Cardiovascular negative cardio ROS Normal cardiovascular exam Rhythm:regular Rate:Normal     Neuro/Psych negative neurological ROS  negative psych ROS   GI/Hepatic negative GI ROS, Neg liver ROS,   Endo/Other  negative endocrine ROS  Renal/GU negative Renal ROS  negative genitourinary   Musculoskeletal   Abdominal   Peds  Hematology negative hematology ROS (+)   Anesthesia Other Findings Past Medical History:   History of seizures                                          Tobacco abuse                                                Polycystic disease, ovaries                                  History of substance abuse                                   Anxiety and depression                                     Past Surgical History:   TONSILLECTOMY                                                 DILATION AND CURETTAGE OF UTERUS                            BMI    Body Mass Index   26.62 kg/m 2     Reproductive/Obstetrics negative OB ROS                             Anesthesia Physical Anesthesia Plan  ASA: II and emergent  Anesthesia Plan: General ETT   Post-op Pain Management:    Induction: Rapid sequence, Cricoid pressure planned and Intravenous  Airway Management Planned:   Additional Equipment:   Intra-op Plan:   Post-operative Plan:   Informed Consent: I have reviewed the patients History and Physical, chart, labs and discussed the procedure including the risks, benefits and  alternatives for the proposed anesthesia with the patient or authorized representative who has indicated his/her understanding and acceptance.   Dental Advisory Given  Plan Discussed with: CRNA  Anesthesia Plan Comments: (Pt denies substance abuse within past 2  weeks.  NPO but recent vomiting.  Will proceed with RSI GOT.  Pt. With active bleeding And although and serum drug screen was requested, we will proceed immediately with Oletha Cruel )       Anesthesia Quick Evaluation

## 2016-03-19 NOTE — Transfer of Care (Signed)
Immediate Anesthesia Transfer of Care Note  Patient: Ashley [facility]  Procedure(s) Performed: Procedure(s): DILATATION AND CURETTAGE (N/A)  Patient Location: PACU  Anesthesia Type:General  Level of Consciousness: awake and alert   Airway & Oxygen Therapy: Patient Spontanous Breathing  Post-op Assessment: Report given to RN  Post vital signs: Reviewed and stable  Last Vitals:  Filed Vitals:   03/18/16 2200 03/19/16 0007  BP: 105/62 114/71  Pulse: 65 56  Temp:    Resp: 18 18    Complications: No apparent anesthesia complications

## 2016-03-19 NOTE — Op Note (Signed)
Operative Note   03/19/2016  PRE-OP DIAGNOSIS: Incomplete abortion   POST-OP DIAGNOSIS: Same  SURGEON: Surgeon(s) and Role:    * Carrizales Bingharlie Lakeesha Fontanilla, MD - Primary  ASSISTANT: None  PROCEDURE:  Suction dilation and curettage  ANESTHESIA: General  ESTIMATED BLOOD LOSS: 25mL intra operative, with approximately 150mL old blood/clot in the cervix and lower uterine segment  DRAINS: 20mL via I/O foley  TOTAL IV FLUIDS: 500mL crystalloid  SPECIMENS: products of conception to pathology  VTE PROPHYLAXIS: Not indicated  ANTIBIOTICS: Doxycycline 100mg  IV x 1 pre op  COMPLICATIONS: none  DISPOSITION: PACU - hemodynamically stable.  CONDITION: stable  BLOOD TYPE: O POS. Rhogamnot applicable  FINDINGS: Exam under anesthesia revealed 10 week sized uterus with no masses and bilateral adnexa without masses or fullness. The cervix was dilated 2cm with blood clot and POCs visible. On inspection of this material, fetus with CRL c/w approximately 7-8weeks was noted and normal sized ball of clot and POCs noted just superior to this that was removed. Gritty texture in all four quadrants and decrease in uterine size to 6-8 week size.   PROCEDURE IN DETAIL:  After informed consent was obtained, the patient was taken to the operating room where anesthesia was obtained without difficulty. The patient was positioned in the dorsal lithotomy position in [place] stirrups. The patient was examined under anesthesia, with the above noted findings.  The bi-valved speculum was placed inside the patient's vagina, and the the anterior lip of the cervix was seen and grasped with the tenaculum.  Material in the endocervical canal was teased and removed with a sponge stick with the above noted findings. The suction was then calibrated to 55mmHg and connected to the number 10 cannula, which was then easily introduced with the above noted findings. A gentle curettage was done, with no products of conception noted and gritty  texture in all four quadrants.   The suction was then done one more time to remove the curettage material.  Excellent hemostasis was noted, and all instruments were removed, with excellent hemostasis noted throughout.  She was then taken out of dorsal lithotomy. Methergine 0.2mg  IM x 1 was given at the conclusion of the case. The patient tolerated the procedure well.  Sponge, lap counts were correct x2.  The patient was taken to recovery room in excellent condition.  Cornelia Copaharlie Vu Liebman, Jr MD [facility]  Pager: [phone]

## 2016-03-19 NOTE — Discharge Instructions (Addendum)
We will discuss your surgery once again in detail at your post-op visit in two to four weeks. If you havent already done so, please call to make your appointment as soon as possible.  [place] [facility] [place]  [address] [place] [address]  [place], [place] [address] [place], [place] [address]  Phone: [phone] Phone: [phone]  Fax: [phone] Fax: [phone]   Dilation and Curettage or Vacuum Curettage, Care After These instructions give you information on caring for yourself after your procedure. Your doctor may also give you more specific instructions. Call your doctor if you have any problems or questions after your procedure. HOME CARE  Do not drive for 24 hours.  Wait 1 week before doing any activities that wear you out.  Do not stand for a long time.  Limit stair climbing to once or twice a day.  Rest often.  Continue with your usual diet.  Drink enough fluids to keep your pee (urine) clear or pale yellow.  If you have a hard time pooping (constipation), you may:  Take a medicine to help you go poop (laxative) as told by your doctor.  Eat more fruit and bran.  Drink more fluids.  Take showers, not baths, for as long as told by your doctor.  Do not swim or use a hot tub until your doctor says it is okay.  Have someone with you for 1day after the procedure.  Do not douche, use tampons, or have sex (intercourse) until seen by your doctor  Only take medicines as told by your doctor. Do not take aspirin. It can cause bleeding.  Keep all doctor visits. GET HELP IF:  You have cramps or pain not helped by medicine.  You have new pain in the belly (abdomen).  You have a bad smelling fluid coming from your vagina.  You have a rash.  You have problems with any medicine. GET HELP RIGHT AWAY IF:   You start to bleed more than a regular period.  You have a fever.  You have chest pain.  You have trouble breathing.  You feel dizzy or feel like  passing out (fainting).  You pass out.  You have pain in the tops of your shoulders.  You have vaginal bleeding with or without clumps of blood (blood clots). MAKE SURE YOU:  Understand these instructions.  Will watch your condition.  Will get help right away if you are not doing well or get worse. Document Released: 09/12/2008 Document Revised: 12/09/2013 Document Reviewed: 07/03/2013 [facility] Patient Information 2015 [employer] [place]. This information is not intended to replace advice given to you by your health care provider. Make sure you discuss any questions you have with your health care provider.      AMBULATORY SURGERY  DISCHARGE INSTRUCTIONS   1) The drugs that you were given will stay in your system until tomorrow so for the next 24 hours you should not:  A) Drive an automobile B) Make any legal decisions C) Drink any alcoholic beverage   2) You may resume regular meals tomorrow.  Today it is better to start with liquids and gradually work up to solid foods.  You may eat anything you prefer, but it is better to start with liquids, then soup and crackers, and gradually work up to solid foods.   3) Please notify your doctor immediately if you have any unusual bleeding, trouble breathing, redness and pain at the surgery site, drainage, fever, or pain not relieved by medication. 4)  5) Your post-operative visit with Dr.                                     is: Date:                        Time:    Please call to schedule your post-operative visit.  6) Additional Instructions: 7)

## 2016-03-20 ENCOUNTER — Encounter: Payer: Self-pay | Admitting: Obstetrics and Gynecology

## 2016-03-20 NOTE — Anesthesia Postprocedure Evaluation (Signed)
Anesthesia Post Note  Patient: Ashley Soto  Procedure(s) Performed: Procedure(s) (LRB): SUCTION DILATATION AND CURETTAGE (N/A)  Patient location during evaluation: PACU Anesthesia Type: General Level of consciousness: awake and alert Pain management: pain level controlled Vital Signs Assessment: post-procedure vital signs reviewed and stable Respiratory status: spontaneous breathing, nonlabored ventilation, respiratory function stable and patient connected to nasal cannula oxygen Cardiovascular status: blood pressure returned to baseline and stable Postop Assessment: no signs of nausea or vomiting Anesthetic complications: no    Last Vitals:  Filed Vitals:   03/19/16 0413 03/19/16 0415  BP:  100/61  Pulse: 63 64  Temp:    Resp: 14 17    Last Pain:  Filed Vitals:   03/19/16 0440  PainSc: 0-No pain                 Yevette EdwardsJames G Adams

## 2016-03-21 LAB — SURGICAL PATHOLOGY

## 2016-03-22 NOTE — Discharge Summary (Signed)
Gynecology Discharge Summary Date of Admission: 03/18/2016 Date of Discharge: 03/19/2016  The patient was admitted to observation for incomplete abortion and underwent an uncomplicated suction D&C; please refer to operative note for full details.  She was meeting all post op goals and discharged to home on POD#0    Medication List    TAKE these medications        acetaminophen-codeine 300-30 MG tablet  Commonly known as:  TYLENOL #3  Take 1 tablet by mouth every 8 (eight) hours as needed for severe pain.     buPROPion 150 MG 24 hr tablet  Commonly known as:  WELLBUTRIN XL  Take 150 mg by mouth every morning.     ibuprofen 200 MG tablet  Commonly known as:  MOTRIN IB  Take 3 tablets (600 mg total) by mouth every 6 (six) hours as needed for fever, mild pain, moderate pain or cramping.     methylergonovine 0.2 MG tablet  Commonly known as:  METHERGINE  Take 1 tablet (0.2 mg total) by mouth every 6 (six) hours.     traZODone 50 MG tablet  Commonly known as:  DESYREL  Take 50 mg by mouth at bedtime as needed. For sleep.        She was told to call for a 2 week follow up appointment.   Cornelia Copaharlie Nehemiah Montee, Jr. MD [facility]

## 2016-09-14 ENCOUNTER — Encounter: Payer: Self-pay | Admitting: Emergency Medicine

## 2016-09-14 ENCOUNTER — Emergency Department: Payer: 59

## 2016-09-14 ENCOUNTER — Inpatient Hospital Stay
Admission: EM | Admit: 2016-09-14 | Discharge: 2016-09-16 | DRG: 775 | Disposition: A | Discharge status: Home / Self Care | Payer: 59 | Attending: Obstetrics and Gynecology | Admitting: Obstetrics and Gynecology

## 2016-09-14 DIAGNOSIS — Z3A21 21 weeks gestation of pregnancy: Secondary | ICD-10-CM | POA: Diagnosis not present

## 2016-09-14 DIAGNOSIS — F17210 Nicotine dependence, cigarettes, uncomplicated: Secondary | ICD-10-CM | POA: Diagnosis present

## 2016-09-14 DIAGNOSIS — O99334 Smoking (tobacco) complicating childbirth: Secondary | ICD-10-CM | POA: Diagnosis present

## 2016-09-14 DIAGNOSIS — R52 Pain, unspecified: Secondary | ICD-10-CM

## 2016-09-14 DIAGNOSIS — O411230 Chorioamnionitis, third trimester, not applicable or unspecified: Secondary | ICD-10-CM | POA: Diagnosis present

## 2016-09-14 DIAGNOSIS — O0932 Supervision of pregnancy with insufficient antenatal care, second trimester: Secondary | ICD-10-CM

## 2016-09-14 HISTORY — DX: Unspecified convulsions: R56.9

## 2016-09-14 LAB — CBC
HCT: 34.4 % — ABNORMAL LOW (ref 35.0–47.0)
Hemoglobin: 12.3 g/dL (ref 12.0–16.0)
MCH: 31.6 pg (ref 26.0–34.0)
MCHC: 35.8 g/dL (ref 32.0–36.0)
MCV: 88.4 fL (ref 80.0–100.0)
Platelets: 219 10*3/uL (ref 150–440)
RBC: 3.89 MIL/uL (ref 3.80–5.20)
RDW: 14.9 % — ABNORMAL HIGH (ref 11.5–14.5)
WBC: 18.7 10*3/uL — ABNORMAL HIGH (ref 3.6–11.0)

## 2016-09-14 LAB — URINALYSIS COMPLETE WITH MICROSCOPIC (ARMC ONLY)
Bilirubin Urine: NEGATIVE
Glucose, UA: 50 mg/dL — AB
Nitrite: NEGATIVE
Protein, ur: NEGATIVE mg/dL
Specific Gravity, Urine: 1.018 (ref 1.005–1.030)
pH: 6 (ref 5.0–8.0)

## 2016-09-14 LAB — TYPE AND SCREEN
ABO/RH(D): O POS
Antibody Screen: NEGATIVE

## 2016-09-14 LAB — CHLAMYDIA/NGC RT PCR (ARMC ONLY)
Chlamydia Tr: NOT DETECTED
N gonorrhoeae: NOT DETECTED

## 2016-09-14 LAB — DIFFERENTIAL
Basophils Absolute: 0.1 10*3/uL (ref 0–0.1)
Basophils Relative: 0 %
Eosinophils Absolute: 0 10*3/uL (ref 0–0.7)
Eosinophils Relative: 0 %
Lymphocytes Relative: 8 %
Lymphs Abs: 1.4 10*3/uL (ref 1.0–3.6)
Monocytes Absolute: 1 10*3/uL — ABNORMAL HIGH (ref 0.2–0.9)
Monocytes Relative: 6 %
Neutro Abs: 16.4 10*3/uL — ABNORMAL HIGH (ref 1.4–6.5)
Neutrophils Relative %: 86 %

## 2016-09-14 LAB — COMPREHENSIVE METABOLIC PANEL
ALT: 22 U/L (ref 14–54)
AST: 19 U/L (ref 15–41)
Albumin: 3.3 g/dL — ABNORMAL LOW (ref 3.5–5.0)
Alkaline Phosphatase: 114 U/L (ref 38–126)
Anion gap: 8 (ref 5–15)
BUN: 7 mg/dL (ref 6–20)
CO2: 24 mmol/L (ref 22–32)
Calcium: 9 mg/dL (ref 8.9–10.3)
Chloride: 101 mmol/L (ref 101–111)
Creatinine, Ser: 0.54 mg/dL (ref 0.44–1.00)
GFR calc Af Amer: >60 mL/min (ref >60)
GFR calc non Af Amer: >60 mL/min (ref >60)
Glucose, Bld: 119 mg/dL — ABNORMAL HIGH (ref 65–99)
Potassium: 3.5 mmol/L (ref 3.5–5.1)
Sodium: 133 mmol/L — ABNORMAL LOW (ref 135–145)
Total Bilirubin: 0.5 mg/dL (ref 0.3–1.2)
Total Protein: 6.9 g/dL (ref 6.5–8.1)

## 2016-09-14 LAB — URINE DRUG SCREEN, QUALITATIVE (ARMC ONLY)
Amphetamines, Ur Screen: NOT DETECTED
Barbiturates, Ur Screen: NOT DETECTED
Benzodiazepine, Ur Scrn: NOT DETECTED
Cannabinoid 50 Ng, Ur ~~LOC~~: NOT DETECTED
Cocaine Metabolite,Ur ~~LOC~~: NOT DETECTED
MDMA (Ecstasy)Ur Screen: NOT DETECTED
Methadone Scn, Ur: NOT DETECTED
Opiate, Ur Screen: NOT DETECTED
Phencyclidine (PCP) Ur S: NOT DETECTED
Tricyclic, Ur Screen: NOT DETECTED

## 2016-09-14 LAB — WET PREP, GENITAL
Clue Cells Wet Prep HPF POC: NONE SEEN
Sperm: NONE SEEN
Trich, Wet Prep: NONE SEEN
Yeast Wet Prep HPF POC: NONE SEEN

## 2016-09-14 LAB — HCG, QUANTITATIVE, PREGNANCY: hCG, Beta Chain, Quant, S: 25201 m[IU]/mL — ABNORMAL HIGH (ref <5)

## 2016-09-14 LAB — RAPID HIV SCREEN (HIV 1/2 AB+AG)
HIV 1/2 Antibodies: NONREACTIVE
HIV-1 P24 Antigen - HIV24: NONREACTIVE

## 2016-09-14 LAB — POCT PREGNANCY, URINE: Preg Test, Ur: POSITIVE — AB

## 2016-09-14 MED ORDER — SODIUM CHLORIDE 0.9 % IV SOLN: 1.0000 g | Freq: Four times a day (QID) | INTRAVENOUS | Status: DC

## 2016-09-14 MED ORDER — LACTATED RINGERS IV SOLN: 500.0000 mL | INTRAVENOUS | Status: DC | PRN

## 2016-09-14 MED ORDER — OXYTOCIN BOLUS FROM INFUSION: 500.0000 mL | Freq: Once | INTRAVENOUS | Status: DC

## 2016-09-14 MED ORDER — AMPICILLIN SODIUM 2 G IJ SOLR
2.0000 g | Freq: Four times a day (QID) | INTRAMUSCULAR | Status: DC
  Administered 2016-09-14 – 2016-09-16 (×6): 2 g via INTRAVENOUS
  Filled 2016-09-14 (×11): qty 2000

## 2016-09-14 MED ORDER — OXYTOCIN 40 UNITS IN LACTATED RINGERS INFUSION - SIMPLE MED
1.0000 m[IU]/min | INTRAVENOUS | Status: DC
  Administered 2016-09-14: 1 m[IU]/min via INTRAVENOUS

## 2016-09-14 MED ORDER — OXYTOCIN 10 UNIT/ML IJ SOLN: 10.0000 [IU] | Freq: Once | INTRAMUSCULAR | Status: DC

## 2016-09-14 MED ORDER — SOD CITRATE-CITRIC ACID 500-334 MG/5ML PO SOLN: 30.0000 mL | ORAL | Status: DC | PRN

## 2016-09-14 MED ORDER — ONDANSETRON HCL 4 MG/2ML IJ SOLN: 4.0000 mg | Freq: Four times a day (QID) | INTRAMUSCULAR | Status: DC | PRN

## 2016-09-14 MED ORDER — NALOXONE HCL 0.4 MG/ML IJ SOLN: 0.4000 mg | INTRAMUSCULAR | Status: DC | PRN

## 2016-09-14 MED ORDER — HYDROMORPHONE 1 MG/ML IV SOLN
INTRAVENOUS | Status: DC
  Administered 2016-09-14: 23:00:00 via INTRAVENOUS
  Administered 2016-09-15: 5 mL via INTRAVENOUS
  Filled 2016-09-14: qty 25

## 2016-09-14 MED ORDER — HYDROMORPHONE HCL 1 MG/ML IJ SOLN
0.5000 mg | INTRAMUSCULAR | Status: AC
  Administered 2016-09-14: 0.5 mg via INTRAVENOUS
  Filled 2016-09-14: qty 1

## 2016-09-14 MED ORDER — LIDOCAINE HCL (PF) 1 % IJ SOLN: 30.0000 mL | INTRAMUSCULAR | Status: DC | PRN

## 2016-09-14 MED ORDER — OXYTOCIN 40 UNITS IN LACTATED RINGERS INFUSION - SIMPLE MED
2.5000 [IU]/h | INTRAVENOUS | Status: DC
  Filled 2016-09-14: qty 1000

## 2016-09-14 MED ORDER — LACTATED RINGERS IV SOLN
INTRAVENOUS | Status: DC
  Administered 2016-09-14: 21:00:00 via INTRAVENOUS

## 2016-09-14 MED ORDER — DIPHENHYDRAMINE HCL 12.5 MG/5ML PO ELIX: 12.5000 mg | ORAL_SOLUTION | Freq: Four times a day (QID) | ORAL | Status: DC | PRN

## 2016-09-14 MED ORDER — SODIUM CHLORIDE 0.9% FLUSH: 9.0000 mL | INTRAVENOUS | Status: DC | PRN

## 2016-09-14 MED ORDER — HYDROMORPHONE HCL 1 MG/ML IJ SOLN
INTRAMUSCULAR | Status: AC
  Administered 2016-09-14: 0.5 mg via INTRAVENOUS
  Filled 2016-09-14: qty 1

## 2016-09-14 MED ORDER — GENTAMICIN SULFATE 40 MG/ML IJ SOLN
120.0000 mg | Freq: Three times a day (TID) | INTRAVENOUS | Status: DC
  Administered 2016-09-14 – 2016-09-15 (×2): 120 mg via INTRAVENOUS
  Filled 2016-09-14 (×4): qty 3

## 2016-09-14 MED ORDER — TERBUTALINE SULFATE 1 MG/ML IJ SOLN
INTRAMUSCULAR | Status: AC
  Administered 2016-09-14: 0.25 mg
  Filled 2016-09-14: qty 1

## 2016-09-14 MED ORDER — HYDROMORPHONE HCL 1 MG/ML IJ SOLN
0.5000 mg | Freq: Once | INTRAMUSCULAR | Status: AC
  Administered 2016-09-14: 0.5 mg via INTRAVENOUS
  Filled 2016-09-14: qty 1

## 2016-09-14 MED ORDER — DIPHENHYDRAMINE HCL 50 MG/ML IJ SOLN: 12.5000 mg | Freq: Four times a day (QID) | INTRAMUSCULAR | Status: DC | PRN

## 2016-09-14 MED ORDER — HYDROMORPHONE HCL 1 MG/ML IJ SOLN
0.5000 mg | INTRAMUSCULAR | Status: DC | PRN
  Administered 2016-09-14: 0.5 mg via INTRAVENOUS

## 2016-09-14 NOTE — H&P (Addendum)
OB History & Physical   History of Present Illness:  Chief Complaint: abdominal pain and cramping  HPI:  Ashley Soto is a 35 y.o. 553P0020 female at 4260w4d dated by an emergent ultrasound in the ED today.  Her pregnancy has been complicated by no prenatal care up to this point. She states that she has very irregular periods and that she had a period just a week ago.  She has not felt different and there was no indication that she was pregnant.    She reports the onset of abdominal discomfort early today with the pain becoming worse at 1pm today.  Given this level of pain she decided to come to the hospital.  Her pain is like bad period cramps and is located on her lower abdomen and in her lower back to some extent.    She denies fevers, but has felt somewhat clammy at times.  She denies chills, nausea, vomiting, upper respiratory symptoms, chest symptoms. Denies nausea, vomiting, diarrhea. She has had some recent constipation.  She denies vaginal bleeding and has had no big gush of fluid.  She has not yet felt the baby move.    Maternal Medical History:   Past Medical History:  Diagnosis Date  . Anxiety and depression   . History of seizures   . History of substance abuse   . Polycystic disease, ovaries   . Seizures (HCC)   . Tobacco abuse     Past Surgical History:  Procedure Laterality Date  . DILATION AND CURETTAGE OF UTERUS    . DILATION AND CURETTAGE OF UTERUS N/A 03/19/2016   Procedure: SUCTION DILATATION AND CURETTAGE;  Surgeon: Potwin Bingharlie Pickens, MD;  Location: [facility] ORS;  Service: Gynecology;  Laterality: N/A;  . TONSILLECTOMY      Allergies  Allergen Reactions  . Sulfa Antibiotics Hives  . Paroxetine Hcl     Other reaction(s): Other (See Comments)    Medications prior to admission: None  OB History  Gravida Para Term Preterm AB Living  3       2    SAB TAB Ectopic Multiple Live Births  2            # Outcome Date GA Lbr Len/2nd Weight Sex Delivery Anes PTL Lv  3  Current           2 SAB 03/19/16     SAB     1 SAB             Obstetric Comments  01/2015: SAB, D&C after failed medical management. Dr Tiburcio PeaHarris, [facility]    Prenatal care site: None so far this pregnancy  Social History: She  reports that she has been smoking Cigarettes.  She has been smoking about 0.25 packs per day. She has never used smokeless tobacco. She reports that she drinks alcohol. She reports that she does not use drugs.  Family History: The patient is asked about her family history and she reports that there are no significant illnesses that run in her family.   Review of Systems: Negative x 10 systems reviewed except as noted in the HPI.    Physical Exam:  Vital Signs: BP 110/72 (BP Location: Right Arm)   Pulse (!) 109   Temp 99.3 F (37.4 C) (Oral)   Ht 5\' 5"  (1.651 m)   Wt 150 lb (68 kg)   LMP 08/31/2016 (Approximate)   SpO2 97%   BMI 24.96 kg/m  General: mild to moderate distress.  HEENT: normocephalic,  atraumatic Heart: tachycardic rate with normal rhythm.  No murmurs/rubs/gallops Lungs: clear to auscultation bilaterally Abdomen: soft, gravid, mild-to-moderate tenderness to palpation.  Pelvic: (female chaperone present during pelvic exam)  External: Normal external female genitalia, a copious, mucopurulent discharge present, cervix mild-to-moderately tender to palpation.  Cervix: Dilation: 3 cm with fetal presenting part at external os (not quite protruding) Extremities: non-tender, symmetric, no edema bilaterally.  DTRs: 2+  Neurologic: Alert & oriented x 3.    Pertinent Results:  Prenatal Labs: Blood type/Rh O positive (per historic labs)  Antibody screen pending  Rubella pending  Varicella pending    RPR pending  HBsAg pending  HIV pending  GC pending  Chlamydia pending  Genetic screening n/a  1 hour GTT N/a  3 hour GTT n/a  GBS unknown    Positive fetal dopp tone: 160  Ultrasound in ER report:   Number of Fetus: 1  Presentation:  cephalic  Fluid: low (deepest pocket 1.5cm)  Placental Location: anterior  Per single biometric measurement: [redacted]w[redacted]d   Assessment:  [name] is a 35 y.o. G72P0020 female at [redacted]w[redacted]d with likely preterm labor. I am very concerned that there is a serious chorioamnionitis given her elevated WBC count and character of her copious mucopurulent discharge.  Also, given her very low fluid level, I am concerned she has been ruptured. Therefore, in the setting of highly suspected infection with low fluid, expectant management is not appropriate and will move forward with induction/augmention for maternal safety.  Plan:  1. Admit to Labor & Delivery  2. NPO, IVF, prenatal panel 3. Gonorrhea, chlamydia and wet prep sent.  UA appears normal 4. GBS unknown.   5. Fetwal well-being: + fetal heart tones 6. Discussed with neonatology. They will be present at delivery.   7. For concern for infection, will start empiric antibiotics for suspicion of chorioamnionitis.  Will also move forward with induction/augmentation.   Conard Novak, MD 09/14/2016 6:38 PM

## 2016-09-14 NOTE — ED Triage Notes (Signed)
Patient presents to the ED with pelvic pain/cramping that began this morning but has been gradually intensifying.  Patient reports pain every 6-8 minutes.   Patient states pain feels similar to a miscarriage she has had before.  Patient denies vaginal bleeding.  Patient denies known pregnancy and reports light menstrual period 2 weeks ago.  Patient appears uncomfortable in triage and abdomen appears to be gravid.

## 2016-09-14 NOTE — ED Provider Notes (Signed)
Patient was listed to come back to C pod, and went to ultrasound with protocol labs and evaluation by triage and was apparently found to have 21 week pregnancy low and was taken to OB/GYN triage without being seen by myself.     Governor Rooksebecca Maximilien Hayashi, MD 09/14/16 [phone]

## 2016-09-14 NOTE — OB Triage Note (Signed)
G3P0 presents at 21w according to u/s with ctx. Just found out she was pregnant in ER today. No bleeding or leaking of fluid. Has not felt baby move.

## 2016-09-15 ENCOUNTER — Encounter: Payer: Self-pay | Admitting: Anesthesiology

## 2016-09-15 ENCOUNTER — Inpatient Hospital Stay: Payer: 59 | Admitting: Anesthesiology

## 2016-09-15 LAB — HEPATITIS B SURFACE ANTIGEN: Hepatitis B Surface Ag: NEGATIVE

## 2016-09-15 LAB — RUBELLA SCREEN: Rubella: 4.69 index (ref >0.99)

## 2016-09-15 LAB — RPR: RPR Ser Ql: NONREACTIVE

## 2016-09-15 MED ORDER — DIBUCAINE 1 % RE OINT: 1.0000 "application " | TOPICAL_OINTMENT | RECTAL | Status: DC | PRN

## 2016-09-15 MED ORDER — IBUPROFEN 600 MG PO TABS
600.0000 mg | ORAL_TABLET | Freq: Four times a day (QID) | ORAL | Status: DC
  Administered 2016-09-15 – 2016-09-16 (×5): 600 mg via ORAL
  Filled 2016-09-15 (×5): qty 1

## 2016-09-15 MED ORDER — GENTAMICIN IN SALINE 1-0.9 MG/ML-% IV SOLN: 100.0000 mg | Freq: Three times a day (TID) | INTRAVENOUS | Status: DC

## 2016-09-15 MED ORDER — SENNOSIDES-DOCUSATE SODIUM 8.6-50 MG PO TABS
2.0000 | ORAL_TABLET | ORAL | Status: DC
  Administered 2016-09-16: 2 via ORAL
  Filled 2016-09-15: qty 2

## 2016-09-15 MED ORDER — SODIUM CHLORIDE 0.9% FLUSH: 3.0000 mL | INTRAVENOUS | Status: DC | PRN

## 2016-09-15 MED ORDER — NALBUPHINE HCL 10 MG/ML IJ SOLN: 5.0000 mg | Freq: Once | INTRAMUSCULAR | Status: DC | PRN

## 2016-09-15 MED ORDER — PRENATAL MULTIVITAMIN CH
1.0000 | ORAL_TABLET | Freq: Every day | ORAL | Status: DC
  Filled 2016-09-15: qty 1

## 2016-09-15 MED ORDER — GENTAMICIN SULFATE 40 MG/ML IJ SOLN
100.0000 mg | Freq: Three times a day (TID) | INTRAVENOUS | Status: DC
  Administered 2016-09-15 – 2016-09-16 (×3): 100 mg via INTRAVENOUS
  Filled 2016-09-15 (×6): qty 2.5

## 2016-09-15 MED ORDER — ACETAMINOPHEN 500 MG PO TABS
1000.0000 mg | ORAL_TABLET | Freq: Once | ORAL | Status: AC
  Administered 2016-09-15: 1000 mg via ORAL

## 2016-09-15 MED ORDER — DIPHENHYDRAMINE HCL 25 MG PO CAPS: 25.0000 mg | ORAL_CAPSULE | ORAL | Status: DC | PRN

## 2016-09-15 MED ORDER — NALOXONE HCL 0.4 MG/ML IJ SOLN: 0.4000 mg | INTRAMUSCULAR | Status: DC | PRN

## 2016-09-15 MED ORDER — FENTANYL 2.5 MCG/ML W/ROPIVACAINE 0.2% IN NS 100 ML EPIDURAL INFUSION (ARMC-ANES)
EPIDURAL | Status: AC
  Filled 2016-09-15: qty 100

## 2016-09-15 MED ORDER — ACETAMINOPHEN 325 MG PO TABS: 650.0000 mg | ORAL_TABLET | ORAL | Status: DC | PRN

## 2016-09-15 MED ORDER — GENTAMICIN IN SALINE 1-0.9 MG/ML-% IV SOLN
100.0000 mg | Freq: Three times a day (TID) | INTRAVENOUS | Status: DC
  Filled 2016-09-15 (×2): qty 100

## 2016-09-15 MED ORDER — NALOXONE HCL 2 MG/2ML IJ SOSY: 1.0000 ug/kg/h | PREFILLED_SYRINGE | INTRAVENOUS | Status: DC | PRN

## 2016-09-15 MED ORDER — LIDOCAINE-EPINEPHRINE (PF) 1.5 %-1:200000 IJ SOLN
INTRAMUSCULAR | Status: DC | PRN
  Administered 2016-09-15: 3 mL via EPIDURAL

## 2016-09-15 MED ORDER — ONDANSETRON HCL 4 MG/2ML IJ SOLN: 4.0000 mg | INTRAMUSCULAR | Status: DC | PRN

## 2016-09-15 MED ORDER — DIPHENHYDRAMINE HCL 25 MG PO CAPS: 25.0000 mg | ORAL_CAPSULE | Freq: Four times a day (QID) | ORAL | Status: DC | PRN

## 2016-09-15 MED ORDER — LIDOCAINE HCL (PF) 1 % IJ SOLN
INTRAMUSCULAR | Status: DC | PRN
  Administered 2016-09-15: 1 mL via INTRADERMAL

## 2016-09-15 MED ORDER — FERROUS SULFATE 325 (65 FE) MG PO TABS
325.0000 mg | ORAL_TABLET | Freq: Two times a day (BID) | ORAL | Status: DC
  Administered 2016-09-15 – 2016-09-16 (×3): 325 mg via ORAL
  Filled 2016-09-15 (×3): qty 1

## 2016-09-15 MED ORDER — ONDANSETRON HCL 4 MG PO TABS: 4.0000 mg | ORAL_TABLET | ORAL | Status: DC | PRN

## 2016-09-15 MED ORDER — FENTANYL 2.5 MCG/ML W/ROPIVACAINE 0.2% IN NS 100 ML EPIDURAL INFUSION (ARMC-ANES)
10.0000 mL/h | EPIDURAL | Status: DC
  Administered 2016-09-15: 10 mL/h via EPIDURAL

## 2016-09-15 MED ORDER — BENZOCAINE-MENTHOL 20-0.5 % EX AERO: 1.0000 "application " | INHALATION_SPRAY | CUTANEOUS | Status: DC | PRN

## 2016-09-15 MED ORDER — NALBUPHINE HCL 10 MG/ML IJ SOLN: 5.0000 mg | INTRAMUSCULAR | Status: DC | PRN

## 2016-09-15 MED ORDER — SODIUM CHLORIDE 0.9 % IV SOLN
INTRAVENOUS | Status: DC | PRN
  Administered 2016-09-15 (×2): 5 mL via EPIDURAL

## 2016-09-15 MED ORDER — SIMETHICONE 80 MG PO CHEW: 80.0000 mg | CHEWABLE_TABLET | ORAL | Status: DC | PRN

## 2016-09-15 MED ORDER — WITCH HAZEL-GLYCERIN EX PADS: 1.0000 "application " | MEDICATED_PAD | CUTANEOUS | Status: DC | PRN

## 2016-09-15 MED ORDER — COCONUT OIL OIL: 1.0000 "application " | TOPICAL_OIL | Status: DC | PRN

## 2016-09-15 MED ORDER — DIPHENHYDRAMINE HCL 50 MG/ML IJ SOLN: 12.5000 mg | INTRAMUSCULAR | Status: DC | PRN

## 2016-09-15 MED ORDER — HYDROCODONE-ACETAMINOPHEN 5-325 MG PO TABS
1.0000 | ORAL_TABLET | ORAL | Status: DC | PRN
  Administered 2016-09-15 – 2016-09-16 (×5): 1 via ORAL
  Filled 2016-09-15 (×5): qty 1

## 2016-09-15 MED ORDER — ACETAMINOPHEN 500 MG PO TABS
ORAL_TABLET | ORAL | Status: AC
Start: 2016-09-15 — End: 2016-09-15
  Administered 2016-09-15: 1000 mg via ORAL
  Filled 2016-09-15: qty 2

## 2016-09-15 MED ORDER — HYDROCODONE-ACETAMINOPHEN 5-325 MG PO TABS: 2.0000 | ORAL_TABLET | ORAL | Status: DC | PRN

## 2016-09-15 NOTE — Progress Notes (Signed)
Admit Date: 09/14/2016 Today's Date: 09/15/2016  Subjective:  Pt has min pain and bleeding at this time. No fever or chills reported. Expresses concern over future pregnancy ability.  Objective: Temp:  [97.8 F (36.6 C)-101 F (38.3 C)] 97.8 F (36.6 C) (09/29 0745) Pulse Rate:  [89-123] 105 (09/29 0617) Resp:  [16-18] 18 (09/29 0745) BP: (96-115)/(47-72) 96/72 (09/29 0617) SpO2:  [95 %-100 %] 98 % (09/29 0600) Weight:  [150 lb (68 kg)] 150 lb (68 kg) (09/28 1546)  Physical Exam:  General: alert, cooperative and no distress Lochia: appropriate Uterine Fundus: firm Incision: none DVT Evaluation: No evidence of DVT seen on physical exam.   Recent Labs  09/14/16 1603  HGB 12.3  HCT 34.4*    Assessment/Plan: 1. 21 5/7 week PTD with likely chorioamnionitis.  Cont ABX to prevent endometritis. 2. Plan 24 hours of ABX, based on fever and pain 3. Delivery per Dr Jean RosenthalJackson, demise.  Pt coping well.  Counseled as to future discussions and investigations due to multiple fetal losses. 4. WBC in am as well.   LOS: 1 day   Letitia Libraobert Paul Rogan Ecklund [facility] 09/15/2016, 8:39 AM

## 2016-09-15 NOTE — Discharge Summary (Signed)
OB Discharge Summary     Patient Name: Ashley Soto DOB: [birth year] MRN: [id number]  Date of admission: 09/14/2016 Delivering MD: Conard NovakJackson, Stephen D, MD  Date of Delivery: 09/15/2016  Date of discharge: 09/15/2016  Admitting diagnosis:  1) intrauterine pregnancy at 2166w5d  2) chorioamnionitis 3) Preterm labor  Intrauterine pregnancy: 9366w5d     Secondary diagnosis: Preterm labor, chorioamnionitis     Discharge diagnosis: Chorioamnionitis, previable delivery delivered                                                                                                Post partum procedures:none  Augmentation: Pitocin  Complications: None  Hospital course:  Onset of Labor With Vaginal Delivery     35 y.o. yo G3P0020 at 1666w5d was admitted in Latent Labor on 09/14/2016. Patient had an uncomplicated labor course as follows:  Membrane Rupture Time/Date: 10:00 PM ,09/14/2016   Intrapartum Procedures: Episiotomy: None [1]                                         Lacerations:  None [1]  Patient had a delivery of a Non Viable infant. 09/15/2016  Information for the patient's newborn:  Alonza SmokerRay, PendingBaby [[id number]][[id number]]  Delivery Method: Vaginal, Spontaneous Delivery (Filed from Delivery Summary)    Pateint had an uncomplicated postpartum course.  She is ambulating, tolerating a regular diet, passing flatus, and urinating well. Patient is discharged home in stable condition on 09/15/16.    Physical exam Vitals:   09/15/16 0555 09/15/16 0557 09/15/16 0600 09/15/16 0617  BP:    96/72  Pulse:    (!) 105  Resp:      Temp:      TempSrc:      SpO2: 95% 95% 98%   Weight:      Height:       General: alert, cooperative and no distress Lochia: appropriate Uterine Fundus: firm Incision: N/A DVT Evaluation: No evidence of DVT seen on physical exam.  Labs: Lab Results  Component Value Date   WBC 18.7 (H) 09/14/2016   HGB 12.3 09/14/2016   HCT 34.4 (L) 09/14/2016   MCV 88.4 09/14/2016   PLT 219  09/14/2016   CMP Latest Ref Rng & Units 09/14/2016  Glucose 65 - 99 mg/dL 782(N119(H)  BUN 6 - 20 mg/dL 7  Creatinine 5.620.44 - 1.301.00 mg/dL 8.650.54  Sodium 784135 - 696145 mmol/L 133(L)  Potassium 3.5 - 5.1 mmol/L 3.5  Chloride 101 - 111 mmol/L 101  CO2 22 - 32 mmol/L 24  Calcium 8.9 - 10.3 mg/dL 9.0  Total Protein 6.5 - 8.1 g/dL 6.9  Total Bilirubin 0.3 - 1.2 mg/dL 0.5  Alkaline Phos 38 - 126 U/L 114  AST 15 - 41 U/L 19  ALT 14 - 54 U/L 22    Discharge instruction: per After Visit Summary.    Medication List    TAKE these medications   ibuprofen 600 MG tablet Commonly known as:  ADVIL,MOTRIN Take 1 tablet (600 mg total) by  mouth every 6 (six) hours.      Diet: routine diet  Activity: Advance as tolerated. Pelvic rest for 6 weeks.   Outpatient follow up: Follow-up Information    Conard Novak, MD Follow up in 2 week(s).   Specialty:  Obstetrics and Gynecology Contact information: [address] [place] [address] [phone]             Postpartum contraception: depo provera Rhogam Given postpartum: no   Newborn Data: Pre-viable female infant Birth Weight:   APGAR: ,   Disposition:for cremation  SIGNED: Vena Austria, MD

## 2016-09-15 NOTE — Anesthesia Procedure Notes (Addendum)
Epidural Patient location during procedure: OB Start time: 09/15/2016 3:21 AM End time: 09/15/2016 3:28 AM  Staffing Anesthesiologist: Margorie JohnPISCITELLO, Adem Costlow K Performed: anesthesiologist   Preanesthetic Checklist Completed: patient identified, site marked, surgical consent, pre-op evaluation, timeout performed, IV checked, risks and benefits discussed and monitors and equipment checked  Epidural Patient position: sitting Prep: Betadine Patient monitoring: heart rate, continuous pulse ox and blood pressure Approach: midline Location: L3-L4 Injection technique: LOR saline  Needle:  Needle type: Tuohy  Needle gauge: 17 G Needle length: 9 cm and 9 Needle insertion depth: 5 cm Catheter type: closed end flexible Catheter size: 19 Gauge Catheter at skin depth: 10 cm Test dose: negative and 1.5% lidocaine with Epi 1:200 K  Assessment Sensory level: T10 Events: blood not aspirated, injection not painful, no injection resistance, negative IV test and no paresthesia  Additional Notes Pt. Evaluated and documentation done after procedure finished. Patient identified. Risks/Benefits/Options discussed with patient including but not limited to bleeding, infection, nerve damage, paralysis, failed block, incomplete pain control, headache, blood pressure changes, nausea, vomiting, reactions to medication both or allergic, itching and postpartum back pain. Confirmed with bedside nurse the patient's most recent platelet count. Confirmed with patient that they are not currently taking any anticoagulation, have any bleeding history or any family history of bleeding disorders. Patient expressed understanding and wished to proceed. All questions were answered. Sterile technique was used throughout the entire procedure. Please see nursing notes for vital signs. Test dose was given through epidural catheter and negative prior to continuing to dose epidural or start infusion. Warning signs of high block given to  the patient including shortness of breath, tingling/numbness in hands, complete motor block, or any concerning symptoms with instructions to call for help. Patient was given instructions on fall risk and not to get out of bed. All questions and concerns addressed with instructions to call with any issues or inadequate analgesia.   Patient tolerated the insertion well without immediate complications.Reason for block:procedure for pain

## 2016-09-15 NOTE — Anesthesia Preprocedure Evaluation (Signed)
Anesthesia Evaluation  Patient identified by MRN, date of birth, ID band Patient awake    Reviewed: Allergy & Precautions, H&P , NPO status , Patient's Chart, lab work & pertinent test results  History of Anesthesia Complications Negative for: history of anesthetic complications  Airway Mallampati: III  TM Distance: >3 FB Neck ROM: full    Dental  (+) Poor Dentition   Pulmonary Current Smoker,    Pulmonary exam normal breath sounds clear to auscultation       Cardiovascular Exercise Tolerance: Good (-) hypertensionnegative cardio ROS Normal cardiovascular exam Rhythm:regular Rate:Normal     Neuro/Psych  Headaches, Seizures -,  PSYCHIATRIC DISORDERS    GI/Hepatic negative GI ROS,   Endo/Other    Renal/GU   negative genitourinary   Musculoskeletal   Abdominal   Peds  Hematology negative hematology ROS (+)   Anesthesia Other Findings Past Medical History: No date: Anxiety and depression No date: History of seizures No date: History of substance abuse No date: Polycystic disease, ovaries No date: Seizures (HCC) No date: Tobacco abuse  Past Surgical History: No date: DILATION AND CURETTAGE OF UTERUS 03/19/2016: DILATION AND CURETTAGE OF UTERUS N/A     Comment: Procedure: SUCTION DILATATION AND CURETTAGE;                Surgeon: Mesick Bingharlie Pickens, MD;  Location: [facility]               ORS;  Service: Gynecology;  Laterality: N/A; No date: TONSILLECTOMY  BMI    Body Mass Index:  24.96 kg/m      Reproductive/Obstetrics (+) Pregnancy                             Anesthesia Physical Anesthesia Plan  ASA: III  Anesthesia Plan: Epidural   Post-op Pain Management:    Induction:   Airway Management Planned:   Additional Equipment:   Intra-op Plan:   Post-operative Plan:   Informed Consent: I have reviewed the patients History and Physical, chart, labs and discussed the procedure  including the risks, benefits and alternatives for the proposed anesthesia with the patient or authorized representative who has indicated his/her understanding and acceptance.     Plan Discussed with: Anesthesiologist  Anesthesia Plan Comments:         Anesthesia Quick Evaluation

## 2016-09-16 LAB — CBC
HCT: 26.3 % — ABNORMAL LOW (ref 35.0–47.0)
Hemoglobin: 9.1 g/dL — ABNORMAL LOW (ref 12.0–16.0)
MCH: 30.5 pg (ref 26.0–34.0)
MCHC: 34.7 g/dL (ref 32.0–36.0)
MCV: 87.9 fL (ref 80.0–100.0)
Platelets: 185 K/uL (ref 150–440)
RBC: 2.99 MIL/uL — ABNORMAL LOW (ref 3.80–5.20)
RDW: 14.6 % — ABNORMAL HIGH (ref 11.5–14.5)
WBC: 17 K/uL — ABNORMAL HIGH (ref 3.6–11.0)

## 2016-09-16 MED ORDER — IBUPROFEN 600 MG PO TABS: 600.0000 mg | ORAL_TABLET | Freq: Four times a day (QID) | ORAL | 0 refills | Status: DC

## 2016-09-16 MED ORDER — MEDROXYPROGESTERONE ACETATE 150 MG/ML IM SUSP
150.0000 mg | Freq: Once | INTRAMUSCULAR | Status: AC
  Administered 2016-09-16: 150 mg via INTRAMUSCULAR
  Filled 2016-09-16: qty 1

## 2016-09-16 NOTE — Progress Notes (Signed)
CH responded to an OR for spiritual support for Pt with a Fetal Loss. Nurse was appreciative of the visit and informed me that the Pt may not be responsive to a visit which was the case. I informed the Pt of our ministry services and that a CH was available 24/7 should she want or need us. Will follow up as needed.    09/16/16 0900  Clinical Encounter Type  Visited With Patient;Health care provider  Visit Type Initial;Spiritual support  Referral From Nurse  Spiritual Encounters  Spiritual Needs Prayer;Emotional

## 2016-09-16 NOTE — Clinical Social Work Note (Signed)
Clinical Social Work Assessment  Patient Details  Name: Ashley Soto MRN: [id number] Date of Birth: 1981-12-17  Date of referral:  09/16/16               Reason for consult:  Grief and Loss                Permission sought to share information with:    Permission granted to share information::  No  Name::        Agency::     Relationship::     Contact Information:     Housing/Transportation Living arrangements for the past 2 months:  Single Family Home Source of Information:  Patient Patient Interpreter Needed:  None Criminal Activity/Legal Involvement Pertinent to Current Situation/Hospitalization:  No - Comment as needed Significant Relationships:  Parents Lives with:  Parents Do you feel safe going back to the place where you live?  Yes Need for family participation in patient care:  No (Coment)  Care giving concerns:  Fetal demise, history of depression with hospitalization for symptoms, history of ETOH and substance use.   Social Worker assessment / plan:  The CSW met with the patient at bedside and introduced herself, her role, and offered condolences for her loss. The patient consented to speak with the CSW but asked that the information not be shared. The CSW assured the patient that because of the nature of the assessment (mental health, substance use), no information would be shared with her family.  The client has a history of substance use and ETOH use, but would not disclose frequency or type; however, she did disclose that she has been sober for 2 years. The client reported that she had a brief voluntary hospitalization for symptoms due to depression "in high school", but that she had been coping well over the past 15-17 years until this situation. In addition to fetal demise, the client was terminated from employment last week and moved back in with her parents. The client reports that she feels safe at home and that she has emotional support from her family. When the CSW  asked about friends, the client became tearful and admitted that she has no friends and the the FOB is not present in her life.  The client denies SI/HI or any past attempts. The CSW offered emotional support during the bedside visit, and she also gave the patient a list of behavioral health options for outpatient in the area who could work with her recent unemployment. The patient thanked the Education officer, museum for both the support and the information.  The CSW offered additional support throughout her stay at [facility] as well as crisis prevention plans for mental health and grief. The client was able to verbalize 3 social supports whom she could call if she was in crisis and three activities she could do to cease intrusive thoughts.   CSW will con't to follow pending any additional needs concerning grief and loss.  Employment status:  Unemployed Forensic scientist:   [facility] [employer] [facility] [employer]) PT Recommendations:  Not assessed at this time Information / Referral to community resources:  Support Groups, Outpatient Psychiatric Care (Comment Required) (Information given about area outpatient psychiatric options. )  Patient/Family's Response to care:  The patient was tearful and appropriately grieving. The patient was withdrawn but cooperated with the interview. The patient thanked the CSW for emotional support.  Patient/Family's Understanding of and Emotional Response to Diagnosis, Current Treatment, and Prognosis:  The patient was able to verbalize safety plans for crisis situations  and how the grief process works.  Emotional Assessment Appearance:  Appears stated age Attitude/Demeanor/Rapport:  Crying, Grieving, Guarded, Lethargic Affect (typically observed):  Depressed, Overwhelmed, Quiet, Sad, Grieving, Tearful/Crying, Withdrawn Orientation:  Oriented to Self, Oriented to Place, Oriented to  Time, Oriented to Situation Alcohol / Substance use:  Alcohol Use, Illicit Drugs (Patient reports no use in the past 2  years.) Psych involvement (Current and /or in the community):  No (Comment)  Discharge Needs  Concerns to be addressed:  Grief and Loss Concerns, Mental Health Concerns, Employment/School Concerns, Coping/Stress Concerns Readmission within the last 30 days:  No Current discharge risk:  None Barriers to Discharge:  No Barriers Identified   Zettie Pho, LCSW 09/16/2016, 1:15 PM

## 2016-09-16 NOTE — Progress Notes (Signed)
Patient understands all discharge instructions and the need to make follow up appointments. Provided patient with information regarding all support resources available  Patient refused wheelchair and walked out with her mother.

## 2016-09-16 NOTE — Anesthesia Postprocedure Evaluation (Signed)
Anesthesia Post Note  Patient: Ashley Soto  Procedure(s) Performed: * No procedures listed *  Patient location during evaluation: Mother Baby Anesthesia Type: Epidural Level of consciousness: awake and alert and oriented Pain management: pain level controlled Vital Signs Assessment: post-procedure vital signs reviewed and stable Respiratory status: spontaneous breathing, nonlabored ventilation and respiratory function stable Cardiovascular status: stable Postop Assessment: no headache, no backache and epidural receding Anesthetic complications: no    Last Vitals:  Vitals:   09/16/16 0411 09/16/16 0733  BP: (!) 98/50 (!) 88/53  Pulse: 65 66  Resp: 18 16  Temp: 36.5 C 36.6 C    Last Pain:  Vitals:   09/16/16 0734  TempSrc:   PainSc: 0-No pain                 Hamda Klutts

## 2016-09-18 LAB — AEROBIC CULTURE W GRAM STAIN (SUPERFICIAL SPECIMEN)

## 2016-09-18 LAB — AEROBIC CULTURE  (SUPERFICIAL SPECIMEN)

## 2016-09-20 LAB — ANAEROBIC CULTURE

## 2016-09-20 LAB — SURGICAL PATHOLOGY

## 2017-07-20 ENCOUNTER — Encounter (HOSPITAL_COMMUNITY): Payer: Self-pay

## 2018-05-16 ENCOUNTER — Encounter: Payer: Self-pay | Admitting: Podiatry

## 2018-05-16 ENCOUNTER — Ambulatory Visit (INDEPENDENT_AMBULATORY_CARE_PROVIDER_SITE_OTHER): Payer: Commercial Managed Care - PPO | Admitting: Podiatry

## 2018-05-16 VITALS — BP 105/62 | HR 73 | Resp 16

## 2018-05-16 DIAGNOSIS — L600 Ingrowing nail: Secondary | ICD-10-CM

## 2018-05-16 DIAGNOSIS — Z87898 Personal history of other specified conditions: Secondary | ICD-10-CM | POA: Insufficient documentation

## 2018-05-16 DIAGNOSIS — F1911 Other psychoactive substance abuse, in remission: Secondary | ICD-10-CM | POA: Insufficient documentation

## 2018-05-16 DIAGNOSIS — M26603 Bilateral temporomandibular joint disorder, unspecified: Secondary | ICD-10-CM | POA: Insufficient documentation

## 2018-05-16 MED ORDER — NEOMYCIN-POLYMYXIN-HC 1 % OT SOLN: OTIC | 1 refills | Status: DC

## 2018-05-16 NOTE — Patient Instructions (Signed)

## 2018-05-16 NOTE — Progress Notes (Signed)
  Subjective:  Patient ID: Ashley Soto, female    DOB: 15-May-1981,  MRN: [id number] HPI Chief Complaint  Patient presents with  . Toe Pain    Hallux left - lateral border, tender x 4 days, red and swollen, trimmed and soaked-helped some  . New Patient (Initial Visit)    37 y.o. female presents with the above complaint.   ROS: Denies fever chills nausea vomiting muscle aches pains calf pain back pain chest pain shortness of breath.  Past Medical History:  Diagnosis Date  . Anxiety and depression   . History of seizures   . History of substance abuse   . Polycystic disease, ovaries   . Seizures (HCC)   . Tobacco abuse    Past Surgical History:  Procedure Laterality Date  . DILATION AND CURETTAGE OF UTERUS    . DILATION AND CURETTAGE OF UTERUS N/A 03/19/2016   Procedure: SUCTION DILATATION AND CURETTAGE;  Surgeon: [place] Bing, MD;  Location: [facility] ORS;  Service: Gynecology;  Laterality: N/A;  . TONSILLECTOMY      Current Outpatient Medications:  .  NEOMYCIN-POLYMYXIN-HYDROCORTISONE (CORTISPORIN) 1 % SOLN OTIC solution, Apply 1-2 drops to toe BID after soaking, Disp: 10 mL, Rfl: 1  Allergies  Allergen Reactions  . Sulfa Antibiotics Hives  . Paroxetine Hcl     Other reaction(s): Other (See Comments)   Review of Systems Objective:   Vitals:   05/16/18 1119  BP: 105/62  Pulse: 73  Resp: 16    General: Well developed, nourished, in no acute distress, alert and oriented x3   Dermatological: Skin is warm, dry and supple bilateral. Nails x 10 are well maintained; remaining integument appears unremarkable at this time. There are no open sores, no preulcerative lesions, no rash or signs of infection present.  Sharp incurvated toenail tibial and fibular border of the hallux left.  Erythema and edema and pain on palpation.  No purulence no malodor.  Vascular: Dorsalis Pedis artery and Posterior Tibial artery pedal pulses are 2/4 bilateral with immedate capillary fill time. Pedal  hair growth present. No varicosities and no lower extremity edema present bilateral.   Neruologic: Grossly intact via light touch bilateral. Vibratory intact via tuning fork bilateral. Protective threshold with Semmes Wienstein monofilament intact to all pedal sites bilateral. Patellar and Achilles deep tendon reflexes 2+ bilateral. No Babinski or clonus noted bilateral.   Musculoskeletal: No gross boney pedal deformities bilateral. No pain, crepitus, or limitation noted with foot and ankle range of motion bilateral. Muscular strength 5/5 in all groups tested bilateral.  Gait: Unassisted, Nonantalgic.    Radiographs:  None taken  Assessment & Plan:   Assessment: Ingrown toenail tibial and fibular border hallux left.  Plan: After 3 cc of a 50-50 mixture of Marcaine plain and lidocaine plain was infiltrated in a hallux block left chemical matrixectomy was performed which she tolerated well.  She was provided with both oral and written home-going instructions for the care and soaking of her toe as well as a prescription for Corticosporin otic to be applied twice daily after soaking.  I will follow-up with her in 2 weeks.     Max T. Mogul, [place]

## 2018-05-28 IMAGING — US US OB LIMITED
1 series · 14 of 28 positions shown · non-contrast
Comparison: none

CLINICAL DATA: Pelvic pain and leaking fluid for 1 day.

EXAM:
LIMITED OBSTETRIC ULTRASOUND

[Series 1: us ob limited · 0.25mm/px · 14 of 47 slices shown]
[im 2/47]
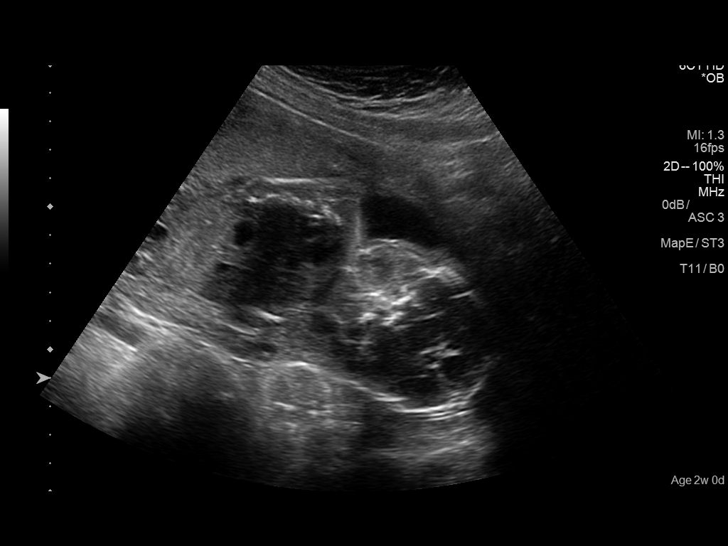
[im 6/47]
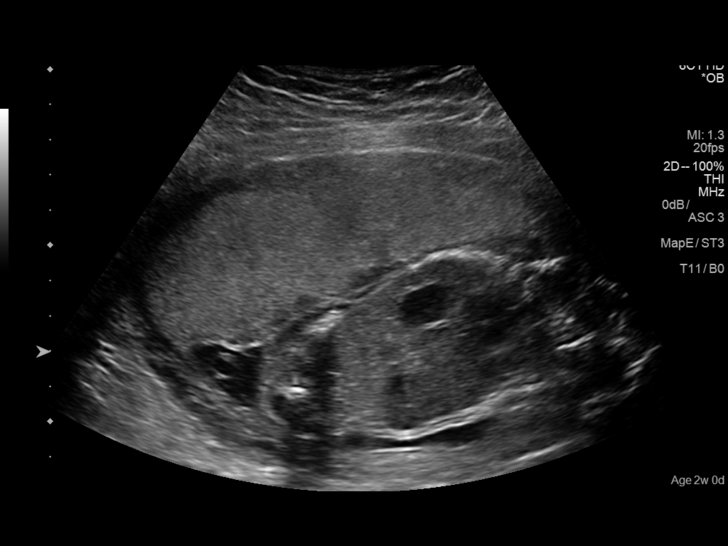
[im 9/47]
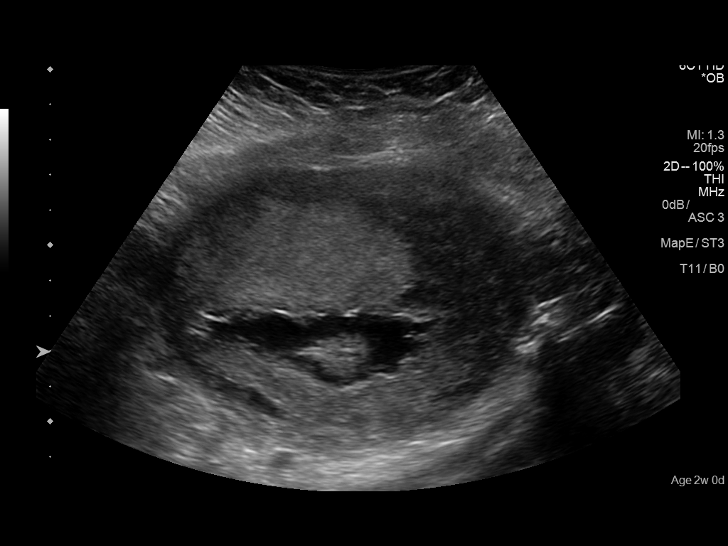
[im 12/47]
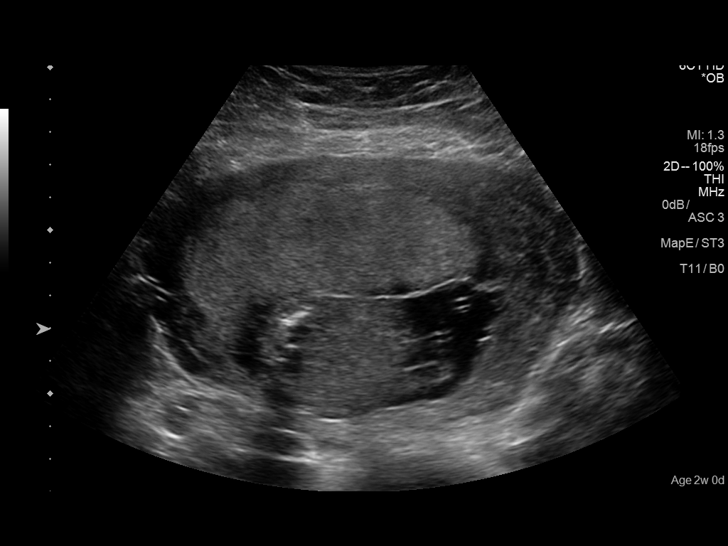
[im 16/47]
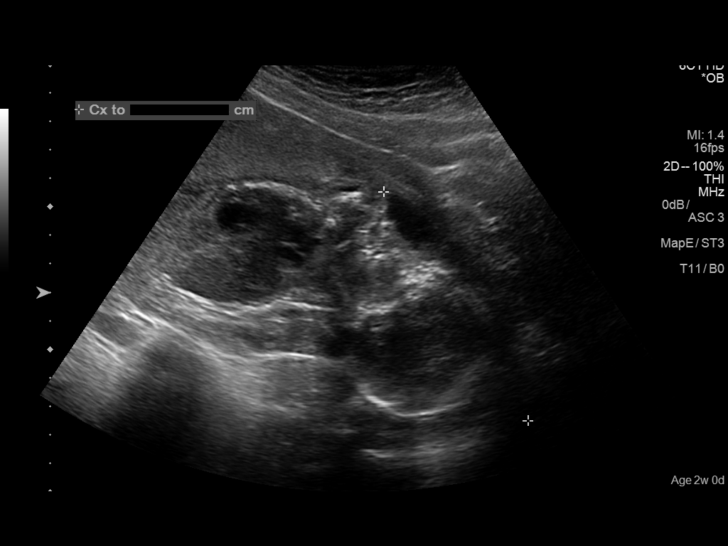
[im 19/47]
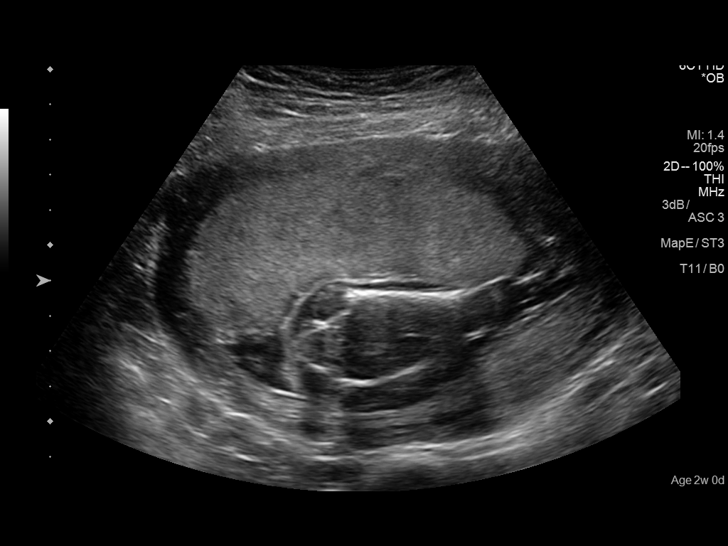
[im 23/47]
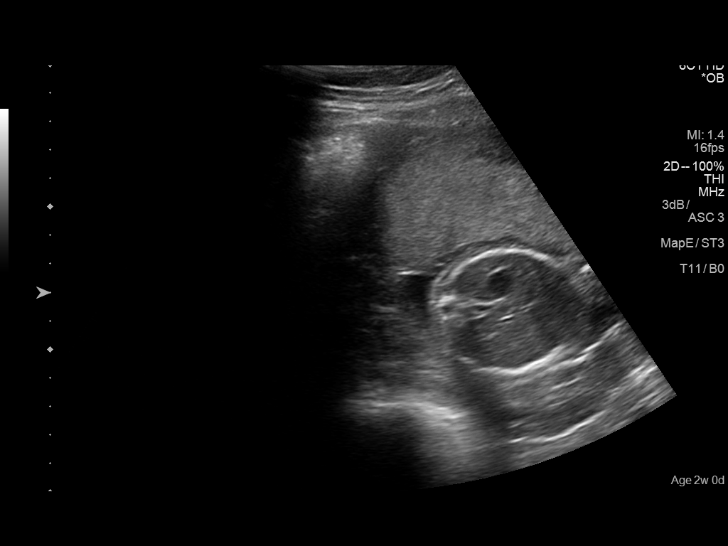
[im 26/47]
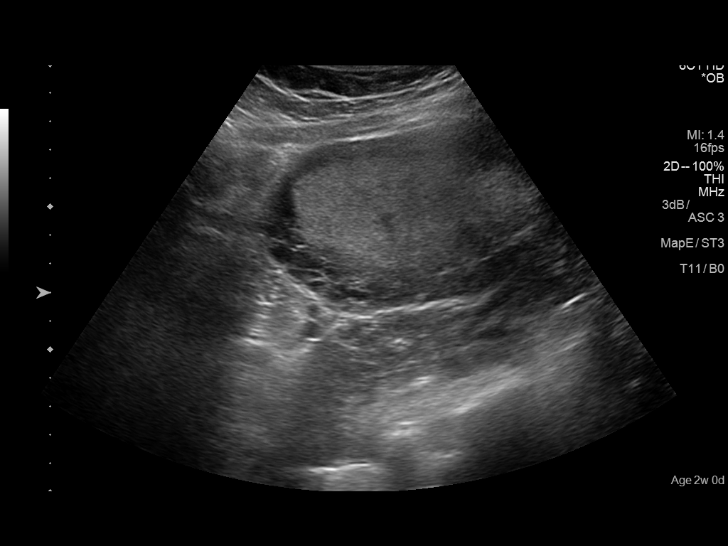
[im 29/47]
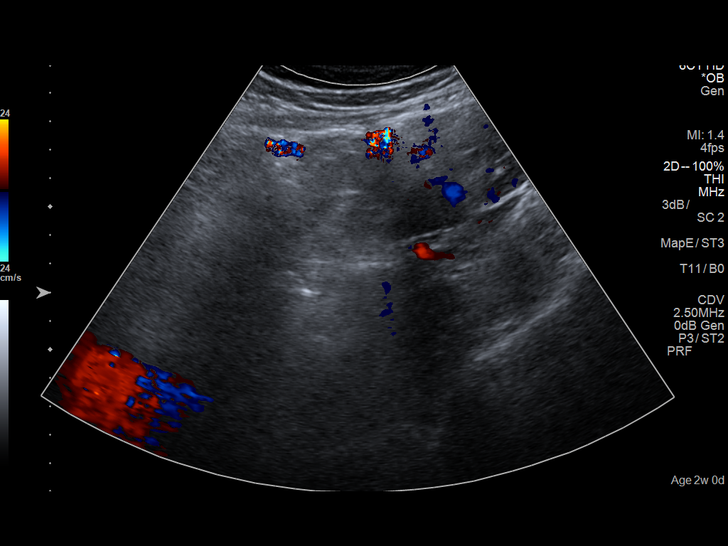
[im 33/47]
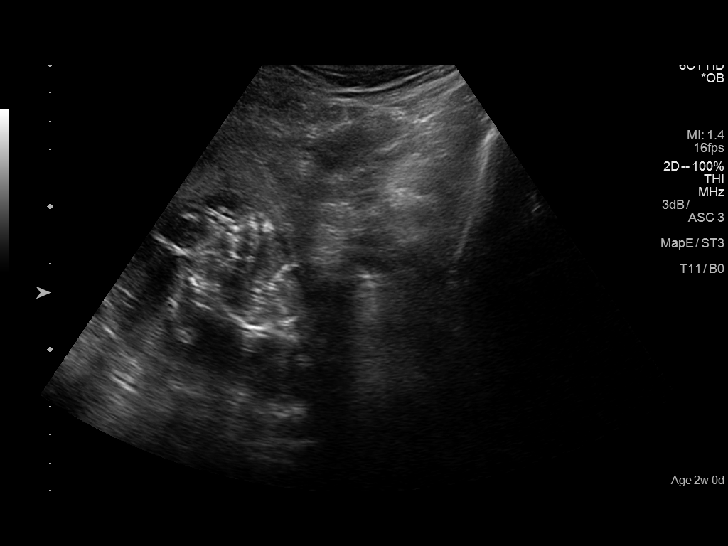
[im 36/47]
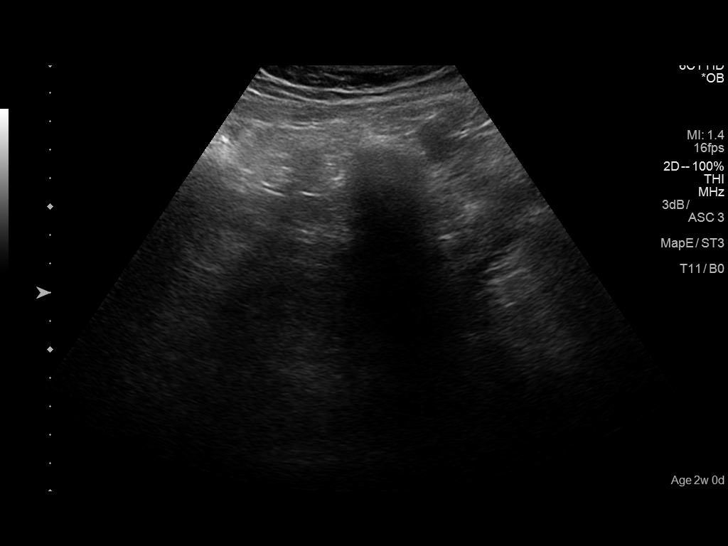
[im 40/47]
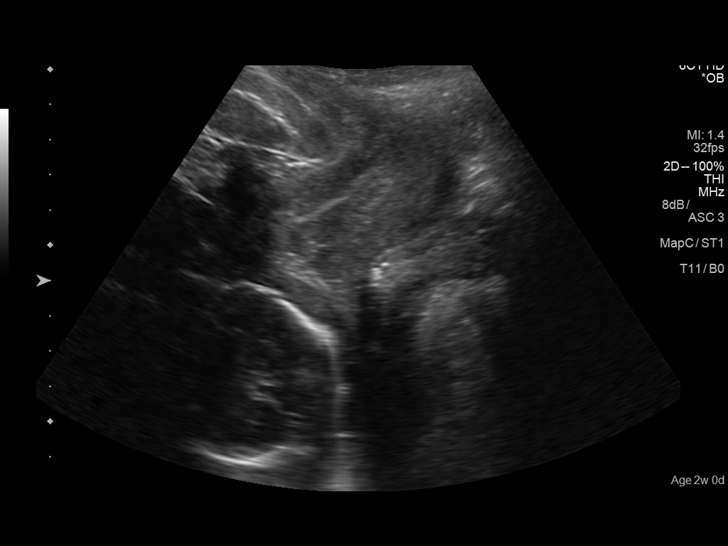
[im 43/47]
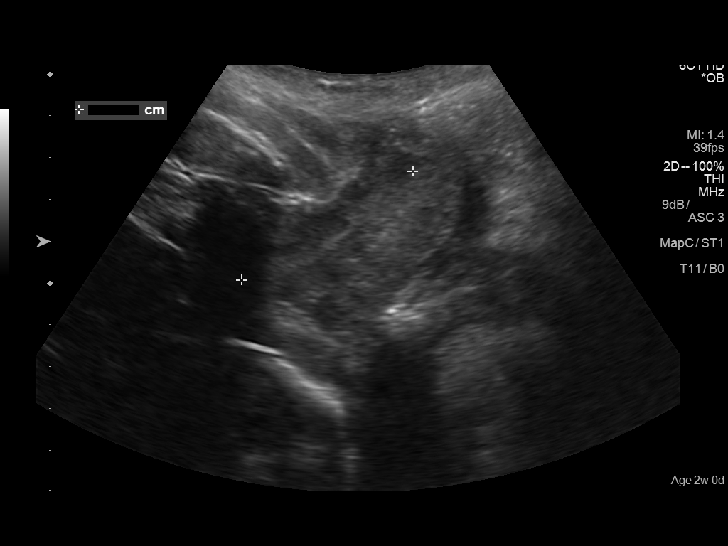
[im 47/47]
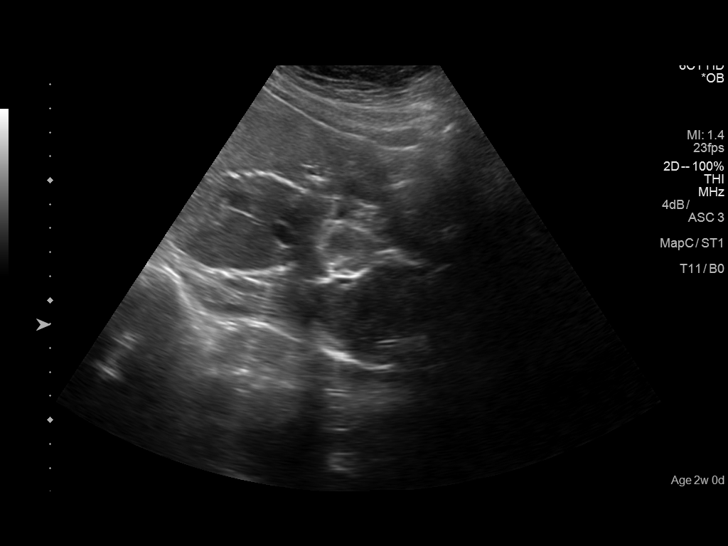

[14 of 28 positions shown; findings below may reference images not displayed]

FINDINGS: Number of Fetuses:  1

Heart Rate:  165 bpm

Movement:  Yes

Presentation: Cephalic

Placental Location: Anterior

Previa: No

Amniotic Fluid (Subjective): Decreased ; largest vertical pocket
measures 1.5 cm

BPD:  3.6cm 21w  4d

MATERNAL FINDINGS:

Cervix: Appears closed, and measures 4.5 cm in length on translabial
scanning.

Uterus/Adnexae:  No abnormality visualized.
IMPRESSION: Single living intrauterine fetus in cephalic presentation at
approximately 21-22 weeks gestational age.

Decreased amniotic fluid volume.

This exam is performed on an emergent basis and does not
comprehensively evaluate fetal size, dating, or anatomy; follow-up
complete OB US should be considered if further fetal assessment is
warranted.

## 2018-06-06 ENCOUNTER — Ambulatory Visit (INDEPENDENT_AMBULATORY_CARE_PROVIDER_SITE_OTHER): Payer: Commercial Managed Care - PPO | Admitting: Podiatry

## 2018-06-06 ENCOUNTER — Encounter: Payer: Self-pay | Admitting: Podiatry

## 2018-06-06 DIAGNOSIS — L600 Ingrowing nail: Secondary | ICD-10-CM

## 2018-06-06 DIAGNOSIS — Z9889 Other specified postprocedural states: Secondary | ICD-10-CM

## 2018-06-06 NOTE — Patient Instructions (Signed)

## 2018-06-06 NOTE — Progress Notes (Signed)
She presents today for follow-up of nail procedure hallux left states that it hurt me a lot on Sunday but other than that is doing very well.  Her graph objective: Vital signs are stable alert and oriented x3 there is no erythema edema cellulitis drainage or odor hallux left.  Assessment: Well-healing surgical foot.  Plan: At this point continue to soak Epsom salts and warm water at least for another week and cover during the day leave open at nighttime.  Should there be any questions or concerns she will notify us immediately.

## 2018-10-23 ENCOUNTER — Ambulatory Visit: Payer: Commercial Managed Care - PPO | Admitting: Obstetrics and Gynecology

## 2018-10-28 ENCOUNTER — Ambulatory Visit (INDEPENDENT_AMBULATORY_CARE_PROVIDER_SITE_OTHER): Payer: Commercial Managed Care - PPO | Admitting: Obstetrics and Gynecology

## 2018-10-28 ENCOUNTER — Encounter: Payer: Self-pay | Admitting: Obstetrics and Gynecology

## 2018-10-28 ENCOUNTER — Other Ambulatory Visit (HOSPITAL_COMMUNITY)
Admission: RE | Admit: 2018-10-28 | Discharge: 2018-10-28 | Disposition: A | Discharge status: Home / Self Care | Payer: Commercial Managed Care - PPO | Source: Ambulatory Visit | Attending: Obstetrics and Gynecology | Admitting: Obstetrics and Gynecology

## 2018-10-28 VITALS — BP 118/74 | Ht 65.0 in | Wt 167.0 lb

## 2018-10-28 DIAGNOSIS — Z113 Encounter for screening for infections with a predominantly sexual mode of transmission: Secondary | ICD-10-CM

## 2018-10-28 DIAGNOSIS — Z1339 Encounter for screening examination for other mental health and behavioral disorders: Secondary | ICD-10-CM

## 2018-10-28 DIAGNOSIS — Z124 Encounter for screening for malignant neoplasm of cervix: Secondary | ICD-10-CM | POA: Diagnosis present

## 2018-10-28 DIAGNOSIS — Z01419 Encounter for gynecological examination (general) (routine) without abnormal findings: Secondary | ICD-10-CM

## 2018-10-28 DIAGNOSIS — Z1331 Encounter for screening for depression: Secondary | ICD-10-CM

## 2018-10-28 LAB — HM PAP SMEAR: HM Pap smear: NORMAL

## 2018-10-28 NOTE — Progress Notes (Signed)
Gynecology Annual Exam  PCP: Leighton Ruff  Chief Complaint  Patient presents with  . Annual Exam   History of Present Illness:  [name] is a 37 y.o. [phone] who LMP was Patient's last menstrual period was 10/02/2018., presents today for her annual examination.  Her menses are irregular every 1-1.5 months, lasting 5 day(s).  Dysmenorrhea none. She does not have intermenstrual bleeding. (patient last had an appointment with me 2 years ago. Information from that appointment is in the prior EMR).  She is single partner, contraception - none.  Last Pap: 6 years  Results were: no abnormalities /neg HPV DNA negative  The states that she had cryotherapy for some abnormalities of her cervix about 15 years ago. Hx of STDs: distant history. She can't remember what it was  Last mammogram: n/a  There is a FH of breast cancer in her mother (age 17) and her paternal grandmother at age 39.Marland Kitchen There is no FH of ovarian cancer. The patient does do self-breast exams.  Tobacco use: 1/2 ppd, 19 years Alcohol use: none Exercise: not active  The patient wears seatbelts: yes.      Patient is a 37 y.o. [id number] presenting for contraception consult.  She is currently on no contraception and desiring to start Depo-Provera injections.  She has a past medical history significant for smoking.  She specifically denies a history of migraine with aura, chronic hypertension and history of DVT/PE.  Reported Patient's last menstrual period was 10/02/2018.Marland Kitchen    Past Medical History:  Diagnosis Date  . Anxiety and depression   . History of seizures   . History of substance abuse (HCC)   . Polycystic disease, ovaries   . Seizures (HCC)   . Tobacco abuse     Past Surgical History:  Procedure Laterality Date  . DILATION AND CURETTAGE OF UTERUS    . DILATION AND CURETTAGE OF UTERUS N/A 03/19/2016   Procedure: SUCTION DILATATION AND CURETTAGE;  Surgeon: [place] Bing, MD;  Location: [facility] ORS;  Service:  Gynecology;  Laterality: N/A;  . TONSILLECTOMY      Prior to Admission medications   Medication Sig Start Date End Date Taking? Authorizing Provider  NEOMYCIN-POLYMYXIN-HYDROCORTISONE (CORTISPORIN) 1 % SOLN OTIC solution Apply 1-2 drops to toe BID after soaking 05/16/18   Hyatt, Max T, DPM  traZODone (DESYREL) 50 MG tablet TAKE 1 TABLET (50 MG TOTAL) BY MOUTH NIGHTLY AS NEEDED FOR SLEEP 10/19/18   [provider]  venlafaxine XR (EFFEXOR-XR) 75 MG 24 hr capsule Take by mouth daily. 10/20/18   [provider]    Allergies  Allergen Reactions  . Sulfa Antibiotics Hives  . Paroxetine Hcl     Other reaction(s): Other (See Comments)    Gynecologic History: Patient's last menstrual period was 10/02/2018.  Obstetric History: G3P0120  Social History   Socioeconomic History  . Marital status: Single    Spouse name: Not on file  . Number of children: Not on file  . Years of education: Not on file  . Highest education level: Not on file  Occupational History  . Not on file  Social Needs  . Financial resource strain: Not on file  . Food insecurity:    Worry: Not on file    Inability: Not on file  . Transportation needs:    Medical: Not on file    Non-medical: Not on file  Tobacco Use  . Smoking status: Current Every Day Smoker    Packs/day: 0.25    Types:  Cigarettes  . Smokeless tobacco: Never Used  Substance and Sexual Activity  . Alcohol use: Yes    Comment: occasionally  . Drug use: No  . Sexual activity: Yes  Lifestyle  . Physical activity:    Days per week: Not on file    Minutes per session: Not on file  . Stress: Not on file  Relationships  . Social connections:    Talks on phone: Not on file    Gets together: Not on file    Attends religious service: Not on file    Active member of club or organization: Not on file    Attends meetings of clubs or organizations: Not on file    Relationship status: Not on file  . Intimate partner violence:     Fear of current or ex partner: Not on file    Emotionally abused: Not on file    Physically abused: Not on file    Forced sexual activity: Not on file  Other Topics Concern  . Not on file  Social History Narrative  . Not on file    Family History  Problem Relation Age of Onset  . Breast cancer Mother     Review of Systems  Constitutional: Negative.   HENT: Negative.   Eyes: Negative.   Respiratory: Negative.   Cardiovascular: Negative.   Gastrointestinal: Negative.   Genitourinary: Negative.   Musculoskeletal: Negative.   Skin: Negative.   Neurological: Negative.   Psychiatric/Behavioral: Negative.      Physical Exam BP 118/74   Ht 5\' 5"  (1.651 m)   Wt 167 lb (75.8 kg)   LMP 10/02/2018   BMI 27.79 kg/m    Physical Exam  Constitutional: She is oriented to person, place, and time. She appears well-developed and well-nourished. No distress.  Genitourinary: Uterus normal. Pelvic exam was performed with patient supine. There is no rash, tenderness, lesion or injury on the right labia. There is no rash, tenderness, lesion or injury on the left labia. No erythema, tenderness or bleeding in the vagina. No signs of injury around the vagina. No vaginal discharge found. Right adnexum does not display mass, does not display tenderness and does not display fullness. Left adnexum does not display mass, does not display tenderness and does not display fullness. Cervix does not exhibit motion tenderness, lesion, discharge or polyp.   Uterus is mobile and anteverted. Uterus is not enlarged, tender or exhibiting a mass.  HENT:  Head: Normocephalic and atraumatic.  Eyes: EOM are normal. No scleral icterus.  Neck: Normal range of motion. Neck supple. No thyromegaly present.  Cardiovascular: Normal rate and regular rhythm. Exam reveals no gallop and no friction rub.  No murmur heard. Pulmonary/Chest: Effort normal and breath sounds normal. No respiratory distress. She has no wheezes. She  has no rales. Right breast exhibits no inverted nipple, no mass, no nipple discharge, no skin change and no tenderness. Left breast exhibits no inverted nipple, no mass, no nipple discharge, no skin change and no tenderness.  Abdominal: Soft. Bowel sounds are normal. She exhibits no distension and no mass. There is no tenderness. There is no rebound and no guarding.  Musculoskeletal: Normal range of motion. She exhibits no edema or tenderness.  Lymphadenopathy:    She has no cervical adenopathy.       Right: No inguinal adenopathy present.       Left: No inguinal adenopathy present.  Neurological: She is alert and oriented to person, place, and time. No cranial nerve deficit.  Skin: Skin is warm and dry. No rash noted. No erythema.  Psychiatric: She has a normal mood and affect. Her behavior is normal. Judgment normal.    Female chaperone present for pelvic and breast  portions of the physical exam  Results: AUDIT Questionnaire (screen for alcoholism): 0 PHQ-9: 6   Assessment: 37 y.o. G43P0020 female here for routine annual gynecologic examination.  Plan: Problem List Items Addressed This Visit    None    Visit Diagnoses    Women's annual routine gynecological examination    -  Primary   Relevant Orders   Cytology - PAP   Screening for depression       Screening for alcoholism       Pap smear for cervical cancer screening       Relevant Orders   Cytology - PAP   Screen for STD (sexually transmitted disease)       Relevant Orders   Cytology - PAP      Screening: -- Blood pressure screen normal -- Colonoscopy - not due -- Mammogram - not due -- Weight screening: overweight: continue to monitor -- Depression screening negative (PHQ-9) -- Nutrition: normal -- cholesterol screening: not due for screening -- osteoporosis screening: not due -- tobacco screening: using: discussed quitting using the 5 A's -- alcohol screening: AUDIT questionnaire indicates low-risk usage. --  family history of breast cancer screening: done. not at high risk. -- no evidence of domestic violence or intimate partner violence. -- STD screening: gonorrhea/chlamydia NAAT collected -- pap smear collected per ASCCP guidelines -- HPV vaccination series: not eligilbe  Thomasene Mohair, MD 10/28/2018 10:20 AM

## 2018-10-30 LAB — CYTOLOGY - PAP
Chlamydia: NEGATIVE
Diagnosis: NEGATIVE
HPV: NOT DETECTED
Neisseria Gonorrhea: NEGATIVE

## 2018-11-06 ENCOUNTER — Encounter: Payer: Self-pay | Admitting: Obstetrics and Gynecology

## 2019-01-15 ENCOUNTER — Telehealth: Payer: Self-pay | Admitting: Obstetrics and Gynecology

## 2019-01-15 NOTE — Telephone Encounter (Signed)
Patient is schedule 01/20/19 with [facility] for nexplanon insertion

## 2019-01-16 NOTE — Telephone Encounter (Signed)
Nexplanon reserved for this patient. 

## 2019-01-20 ENCOUNTER — Ambulatory Visit (INDEPENDENT_AMBULATORY_CARE_PROVIDER_SITE_OTHER): Payer: Commercial Managed Care - PPO | Admitting: Obstetrics and Gynecology

## 2019-01-20 ENCOUNTER — Encounter: Payer: Self-pay | Admitting: Obstetrics and Gynecology

## 2019-01-20 VITALS — BP 118/74 | Ht 65.0 in | Wt 170.0 lb

## 2019-01-20 DIAGNOSIS — Z3046 Encounter for surveillance of implantable subdermal contraceptive: Secondary | ICD-10-CM

## 2019-01-20 DIAGNOSIS — Z30017 Encounter for initial prescription of implantable subdermal contraceptive: Secondary | ICD-10-CM

## 2019-01-20 NOTE — Patient Instructions (Signed)
Nexplanon Instructions After Insertion  Keep bandage clean and dry for 24 hours  May use ice/Tylenol/Ibuprofen for soreness or pain  If you develop fever, drainage or increased warmth from incision site-contact office immediately   

## 2019-01-20 NOTE — Progress Notes (Signed)
    GYNECOLOGY PROCEDURE NOTE  Patient is a 38 y.o. P7T0626 presenting for Nexplanon insertion as her desires means of contraception.  She provided informed consent, signed copy in the chart, time out was performed. Pregnancy test was negative (she is on her period now), with self reported LMP of Patient's last menstrual period was 01/16/2019.  She understands that Nexplanon is a progesterone only therapy, and that patients often patients have irregular and unpredictable vaginal bleeding or amenorrhea. She understands that other side effects are possible related to systemic progesterone, including but not limited to, headaches, breast tenderness, nausea, and irritability. While effective at preventing pregnancy long acting reversible contraceptives do not prevent transmission of sexually transmitted diseases and use of barrier methods for this purpose was discussed. The placement procedure for Nexplanon was reviewed with the patient in detail including risks of nerve injury, infection, bleeding and injury to other muscles or tendons. She understands that the Nexplanon implant is good for 3 years and needs to be removed at the end of that time.  She understands that Nexplanon is an extremely effective option for contraception, with failure rate of <1%. This information is reviewed today and all questions were answered. Informed consent was obtained, both verbally and written.   The patient is healthy and has no contraindications to Nexplanon use. Urine pregnancy test was performed today and was negative.  Procedure Appropriate time out taken.  Patient placed in dorsal supine with left arm above head, elbow flexed at 90 degrees, arm resting on examination table with hand behind her head.  The bicipital grove was palpated and site 8-10cm proximal to the medial epicondyle was indentified.  Per the manufacturer's recommendations, the insertion site was marked along a line 3-5 cm posterior (toward the triceps)  to the bicipital groove and at 8-10 cm medial to the medial epicondyle. The insertion site was prepped with a two betadine swabs and then injected with 3 cc of 1% lidocaine without epinephrine.  Nexplanon removed form sterile blister packaging,  Device confirmed in needle, before inserting full length of needle, tenting up the skin as the needle was advance.  The drug eluting rod was then deployed by pulling back the slider per the manufactures recommendation.  The implant was palpable by the clinician as well as the patient.  The insertion site covered dressed with a 1/2" steri-strip before applying  a kerlex bandage pressure dressing..Minimal blood loss was noted during the procedure.  The patient tolerated the procedure well.   She was instructed to wear the bandage for 24 hours, call with any signs of infection.  She was given the Nexplanon card and instructed to have the rod removed in 3 years.  Thomasene Mohair, MD, [facility], [facility] 01/20/2019 11:19 AM

## 2020-11-01 ENCOUNTER — Encounter: Payer: Self-pay | Admitting: Obstetrics and Gynecology

## 2020-11-01 ENCOUNTER — Other Ambulatory Visit: Payer: Self-pay

## 2020-11-01 ENCOUNTER — Ambulatory Visit (INDEPENDENT_AMBULATORY_CARE_PROVIDER_SITE_OTHER): Payer: Commercial Managed Care - PPO | Admitting: Obstetrics and Gynecology

## 2020-11-01 VITALS — BP 122/74 | Ht 65.0 in | Wt 153.0 lb

## 2020-11-01 DIAGNOSIS — N6322 Unspecified lump in the left breast, upper inner quadrant: Secondary | ICD-10-CM | POA: Diagnosis not present

## 2020-11-01 NOTE — Progress Notes (Signed)
Obstetrics & Gynecology Office Visit   Chief Complaint  Patient presents with  . Breast Pain   History of Present Illness: 39 y.o. G20P0020 female with a several day history of noting a lump in her left breast near the midline.  The area is tender to palpation and mobile. She can recall no trauma to the area.  Sh notes no skin or nipple changes. Denies any other lumps. She has a family history of breast cancer in her mother and paternal grandmother.   Past Medical History:  Diagnosis Date  . Anxiety and depression   . History of seizures   . History of substance abuse (HCC)   . Polycystic disease, ovaries   . Seizures (HCC)   . Tobacco abuse     Past Surgical History:  Procedure Laterality Date  . DILATION AND CURETTAGE OF UTERUS    . DILATION AND CURETTAGE OF UTERUS N/A 03/19/2016   Procedure: SUCTION DILATATION AND CURETTAGE;  Surgeon: [place] Bing, MD;  Location: [facility] OR[facility];  Service: Gynecology;  Laterality: N/A;  . TONSILLECTOMY      Gynecologic History: No LMP recorded.  Obstetric History: Ashley Soto  Family History  Problem Relation Age of Onset  . Breast cancer Mother     Social History   Socioeconomic History  . Marital status: Single    Spouse name: Not on file  . Number of children: Not on file  . Years of education: Not on file  . Highest education level: Not on file  Occupational History  . Not on file  Tobacco Use  . Smoking status: Current Every Day Smoker    Packs/day: 0.25    Types: Cigarettes  . Smokeless tobacco: Never Used  Substance and Sexual Activity  . Alcohol use: Yes    Comment: occasionally  . Drug use: No  . Sexual activity: Yes  Other Topics Concern  . Not on file  Social History Narrative  . Not on file   Social Determinants of Health   Financial Resource Strain:   . Difficulty of Paying Living Expenses: Not on file  Food Insecurity:   . Worried About Programme researcher, broadcasting/film/video in the Last Year: Not on file  . Ran Out of Food in  the Last Year: Not on file  Transportation Needs:   . Lack of Transportation (Medical): Not on file  . Lack of Transportation (Non-Medical): Not on file  Physical Activity:   . Days of Exercise per Week: Not on file  . Minutes of Exercise per Session: Not on file  Stress:   . Feeling of Stress : Not on file  Social Connections:   . Frequency of Communication with Friends and Family: Not on file  . Frequency of Social Gatherings with Friends and Family: Not on file  . Attends Religious Services: Not on file  . Active Member of Clubs or Organizations: Not on file  . Attends Banker Meetings: Not on file  . Marital Status: Not on file  Intimate Partner Violence:   . Fear of Current or Ex-Partner: Not on file  . Emotionally Abused: Not on file  . Physically Abused: Not on file  . Sexually Abused: Not on file    Allergies  Allergen Reactions  . Sulfa Antibiotics Hives  . Paroxetine Hcl     Other reaction(s): Other (See Comments)    Prior to Admission medications   Medication Sig Start Date End Date Taking? Authorizing Provider  NEOMYCIN-POLYMYXIN-HYDROCORTISONE (CORTISPORIN) 1 % SOLN OTIC  solution Apply 1-2 drops to toe BID after soaking Patient not taking: Reported on 01/20/2019 05/16/18   Hyatt, Max T, DPM  traZODone (DESYREL) 50 MG tablet TAKE 1 TABLET (50 MG TOTAL) BY MOUTH NIGHTLY AS NEEDED FOR SLEEP 10/19/18   [provider]  venlafaxine XR (EFFEXOR-XR) 75 MG 24 hr capsule Take by mouth daily. 10/20/18   [provider]    Review of Systems  Constitutional: Negative.   HENT: Negative.   Eyes: Negative.   Respiratory: Negative.   Cardiovascular: Negative.   Gastrointestinal: Negative.   Genitourinary: Negative.   Musculoskeletal: Negative.   Skin: Negative.   Neurological: Negative.   Psychiatric/Behavioral: Negative.      Physical Exam BP 122/74   Ht 5\' 5"  (1.651 m)   Wt 153 lb (69.4 kg)   BMI 25.46 kg/m  No LMP recorded. Physical  Exam Constitutional:      General: She is not in acute distress.    Appearance: Normal appearance.  HENT:     Head: Normocephalic and atraumatic.  Eyes:     General: No scleral icterus.    Conjunctiva/sclera: Conjunctivae normal.  Chest:     Breasts:        Right: No inverted nipple, mass, nipple discharge, skin change or tenderness.        Left: Mass and tenderness present. No inverted nipple, nipple discharge or skin change.    Neurological:     General: No focal deficit present.     Mental Status: She is alert and oriented to person, place, and time.     Cranial Nerves: No cranial nerve deficit.  Psychiatric:        Mood and Affect: Mood normal.        Behavior: Behavior normal.        Judgment: Judgment normal.     Female chaperone present for pelvic and breast  portions of the physical exam  Assessment: 39 y.o. G9P0020 female here for  1. Breast lump on left side at 10 o'clock position      Plan: Problem List Items Addressed This Visit    None    Visit Diagnoses    Breast lump on left side at 10 o'clock position    -  Primary   Relevant Orders   MM DIAG BREAST TOMO BILATERAL   G1P141 BREAST LTD UNI RIGHT INC AXILLA   US BREAST LTD UNI LEFT INC AXILLA     Small left breast lump of indeterminate significance.  Now onset. Likely benign, but strong family history.  Orders for mammogram and u/s as above.   A total of 20 minutes were spent face-to-face with the patient as well as preparation, review, communication, and documentation during this encounter.    [place], MD 11/01/2020 4:41 PM

## 2020-11-02 ENCOUNTER — Encounter: Payer: Self-pay | Admitting: Obstetrics and Gynecology

## 2020-11-10 ENCOUNTER — Other Ambulatory Visit: Payer: Commercial Managed Care - PPO

## 2020-11-22 ENCOUNTER — Ambulatory Visit
Admission: RE | Admit: 2020-11-22 | Discharge: 2020-11-22 | Disposition: A | Discharge status: Home / Self Care | Payer: Commercial Managed Care - PPO | Source: Ambulatory Visit | Attending: Obstetrics and Gynecology | Admitting: Obstetrics and Gynecology

## 2020-11-22 ENCOUNTER — Other Ambulatory Visit: Payer: Self-pay

## 2020-11-22 DIAGNOSIS — N6322 Unspecified lump in the left breast, upper inner quadrant: Secondary | ICD-10-CM

## 2020-12-15 ENCOUNTER — Other Ambulatory Visit: Payer: Self-pay | Admitting: Orthopedic Surgery

## 2020-12-23 ENCOUNTER — Encounter
Admission: RE | Admit: 2020-12-23 | Discharge: 2020-12-23 | Disposition: A | Discharge status: Home / Self Care | Payer: Commercial Managed Care - PPO | Source: Ambulatory Visit | Attending: Orthopedic Surgery | Admitting: Orthopedic Surgery

## 2020-12-23 ENCOUNTER — Other Ambulatory Visit: Payer: Self-pay

## 2020-12-23 HISTORY — DX: Tinea corporis: B35.4

## 2020-12-23 NOTE — Patient Instructions (Signed)
INSTRUCTIONS FOR SURGERY     Your surgery is scheduled for:   January 11TH, 2022     To find out your arrival time for the day of surgery,          please call [phone] between 1 pm and 3 pm on : Monday, January 10TH, 2022     When you arrive for surgery, report to the REGISTRATION DESK IN THE MEDICAL MALL.       THEY WILL SEND YOU TO THE SECOND FLOOR SURGICAL DESK TO SIGN IN.    REMEMBER: Instructions that are not followed completely may result in serious medical risk,  up to and including death, or upon the discretion of your surgeon and anesthesiologist,            your surgery may need to be rescheduled.  __X__ 1. Do not eat food after midnight the night before your procedure.                    No gum, candy, lozenger, tic tacs, tums or hard candies.                  ABSOLUTELY NOTHING SOLID IN YOUR MOUTH AFTER MIDNIGHT                    You may drink unlimited clear liquids up to 2 hours before you are scheduled to arrive for surgery.                   Do not drink anything within those 2 hours unless you need to take medicine, then take the                   smallest amount you need.  Clear liquids include:  water, apple juice without pulp,                   any flavor Gatorade, Black coffee, black tea.  Sugar may be added but no dairy/ honey /lemon.                        Broth and jello is not considered a clear liquid.  __x__  2. On the morning of surgery, please brush your teeth with toothpaste and water. You may rinse with                  mouthwash if you wish but DO NOT SWALLOW TOOTHPASTE OR MOUTHWASH  __X___3. NO alcohol for 24 hours before or after surgery.  __x___ 4.  Do NOT smoke or use e-cigarettes for 24 HOURS PRIOR TO SURGERY.                      DO NOT Use any chewable tobacco products for at least 6 hours prior to surgery.  __x___ 5. If you start any new medication after this appointment and prior to  surgery, please                   Bring it with you on the day of surgery.  ___x__ 6. Notify your doctor if there  is any change in your medical condition, such as fever,                  infection, vomitting, diarrhea or any open sores.  __x___ 7.  USE the CHG SOAP as instructed, the night before surgery and the day of surgery.                   Once you have washed with this soap, do NOT use any of the following: Powders, perfumes                    or lotions. Please do not wear make up, hairpins, clips or nail polish. You may wear deodorant.                   Men may shave their face and neck.  Women need to shave 48 hours prior to surgery.                   DO NOT wear ANY jewelry on the day of surgery. If there are rings that are too tight to                    remove easily, please address this prior to the surgery day. Piercings need to be removed.                                                                     NO METAL ON YOUR BODY.                    Do NOT bring any valuables.  If you came to Pre-Admit testing then you will not need license,                     insurance card or credit card.  If you will be staying overnight, please either leave your things in                     the car or have your family be responsible for these items.                     [facility] IS NOT RESPONSIBLE FOR BELONGINGS OR VALUABLES.  ___X__ 8. DO NOT wear contact lenses on surgery day.  You may not have dentures,                     Hearing aides, contacts or glasses in the operating room. These items can be                    Placed in the Recovery Room to receive immediately after surgery.  __x___ 9. IF YOU ARE SCHEDULED TO GO HOME ON THE SAME DAY, YOU MUST                   Have someone to drive you home and to stay with you  for the first 24 hours.                    Have an arrangement prior to arriving on surgery day.  ___x__ 10. Take the  following medications on the morning of surgery  with a sip of water:                              1. EFFEXOR                     2.                      __X___ 11.  Follow any instructions provided to you by your surgeon.                        Such as enema, clear liquid bowel prep                     PLEASE HAVE THE PRESURGICAL CARBOHYDRATE DRINK COMPLETED                      TWO HOURS PRIOR TO ARRIVAL TO THE HOSPITAL.  __X__  12. STOP ALL ASPIRIN PRODUCTS AS OF 12/23/20.                       THIS INCLUDES BC POWDERS / GOODIES POWDER  __x___ 13. STOP Anti-inflammatories as of 12/23/20.                      This includes IBUPROFEN / MOTRIN / ADVIL / ALEVE/ NAPROXYN                    YOU MAY TAKE TYLENOL ANY TIME PRIOR TO SURGERY.  __X____18. Wear clean and comfortable clothing to the hospital.                 MAKE SURE YOUR SHIRT HAS LOOSE OR STRETCHY SLEEVES.

## 2020-12-24 ENCOUNTER — Other Ambulatory Visit
Admission: RE | Admit: 2020-12-24 | Discharge: 2020-12-24 | Disposition: A | Discharge status: Home / Self Care | Payer: Commercial Managed Care - PPO | Source: Ambulatory Visit | Attending: Orthopedic Surgery | Admitting: Orthopedic Surgery

## 2020-12-24 DIAGNOSIS — Z20822 Contact with and (suspected) exposure to covid-19: Secondary | ICD-10-CM | POA: Diagnosis not present

## 2020-12-24 DIAGNOSIS — Z01812 Encounter for preprocedural laboratory examination: Secondary | ICD-10-CM | POA: Insufficient documentation

## 2020-12-25 LAB — SARS CORONAVIRUS 2 (TAT 6-24 HRS): SARS Coronavirus 2: NEGATIVE

## 2020-12-28 ENCOUNTER — Ambulatory Visit: Payer: Commercial Managed Care - PPO

## 2020-12-28 ENCOUNTER — Encounter: Payer: Self-pay | Admitting: Orthopedic Surgery

## 2020-12-28 ENCOUNTER — Ambulatory Visit
Admission: RE | Admit: 2020-12-28 | Discharge: 2020-12-28 | Disposition: A | Discharge status: Home / Self Care | Payer: Commercial Managed Care - PPO | Attending: Orthopedic Surgery | Admitting: Orthopedic Surgery

## 2020-12-28 ENCOUNTER — Encounter
Admission: RE | Disposition: A | Discharge status: Home / Self Care | Payer: Self-pay | Source: Home / Self Care | Attending: Orthopedic Surgery

## 2020-12-28 ENCOUNTER — Other Ambulatory Visit: Payer: Self-pay

## 2020-12-28 DIAGNOSIS — Z79899 Other long term (current) drug therapy: Secondary | ICD-10-CM | POA: Diagnosis not present

## 2020-12-28 DIAGNOSIS — M654 Radial styloid tenosynovitis [de Quervain]: Secondary | ICD-10-CM | POA: Insufficient documentation

## 2020-12-28 DIAGNOSIS — Z888 Allergy status to other drugs, medicaments and biological substances status: Secondary | ICD-10-CM | POA: Diagnosis not present

## 2020-12-28 DIAGNOSIS — F17200 Nicotine dependence, unspecified, uncomplicated: Secondary | ICD-10-CM | POA: Insufficient documentation

## 2020-12-28 DIAGNOSIS — Z882 Allergy status to sulfonamides status: Secondary | ICD-10-CM | POA: Diagnosis not present

## 2020-12-28 HISTORY — PX: DORSAL COMPARTMENT RELEASE: SHX5039

## 2020-12-28 LAB — POCT PREGNANCY, URINE: Preg Test, Ur: NEGATIVE

## 2020-12-28 SURGERY — RELEASE, FIRST DORSAL COMPARTMENT, HAND
Anesthesia: General | Laterality: Left

## 2020-12-28 MED ORDER — CEFAZOLIN SODIUM-DEXTROSE 2-4 GM/100ML-% IV SOLN
2.0000 g | INTRAVENOUS | Status: AC
  Administered 2020-12-28: 2 g via INTRAVENOUS

## 2020-12-28 MED ORDER — LIDOCAINE HCL (CARDIAC) PF 100 MG/5ML IV SOSY
PREFILLED_SYRINGE | INTRAVENOUS | Status: DC | PRN
  Administered 2020-12-28: 80 mg via INTRAVENOUS

## 2020-12-28 MED ORDER — ONDANSETRON HCL 4 MG/2ML IJ SOLN: 4.0000 mg | Freq: Four times a day (QID) | INTRAMUSCULAR | Status: DC | PRN

## 2020-12-28 MED ORDER — CHLORHEXIDINE GLUCONATE 0.12 % MT SOLN
OROMUCOSAL | Status: AC
  Filled 2020-12-28: qty 15

## 2020-12-28 MED ORDER — METOCLOPRAMIDE HCL 10 MG PO TABS: 5.0000 mg | ORAL_TABLET | Freq: Three times a day (TID) | ORAL | Status: DC | PRN

## 2020-12-28 MED ORDER — MIDAZOLAM HCL 2 MG/2ML IJ SOLN
INTRAMUSCULAR | Status: AC
  Filled 2020-12-28: qty 2

## 2020-12-28 MED ORDER — PROPOFOL 10 MG/ML IV BOLUS
INTRAVENOUS | Status: AC
  Filled 2020-12-28: qty 20

## 2020-12-28 MED ORDER — FENTANYL CITRATE (PF) 100 MCG/2ML IJ SOLN: 25.0000 ug | INTRAMUSCULAR | Status: DC | PRN

## 2020-12-28 MED ORDER — PROMETHAZINE HCL 25 MG/ML IJ SOLN: 6.2500 mg | INTRAMUSCULAR | Status: DC | PRN

## 2020-12-28 MED ORDER — NEOMYCIN-POLYMYXIN B GU 40-200000 IR SOLN
Status: AC
  Filled 2020-12-28: qty 2

## 2020-12-28 MED ORDER — SODIUM CHLORIDE 0.9 % IV SOLN: INTRAVENOUS | Status: DC

## 2020-12-28 MED ORDER — MIDAZOLAM HCL 2 MG/2ML IJ SOLN
INTRAMUSCULAR | Status: DC | PRN
  Administered 2020-12-28: 2 mg via INTRAVENOUS

## 2020-12-28 MED ORDER — ORAL CARE MOUTH RINSE: 15.0000 mL | Freq: Once | OROMUCOSAL | Status: AC

## 2020-12-28 MED ORDER — BUPIVACAINE HCL (PF) 0.5 % IJ SOLN
INTRAMUSCULAR | Status: DC | PRN
  Administered 2020-12-28: 5 mL

## 2020-12-28 MED ORDER — CEFAZOLIN SODIUM-DEXTROSE 2-4 GM/100ML-% IV SOLN
INTRAVENOUS | Status: AC
  Filled 2020-12-28: qty 100

## 2020-12-28 MED ORDER — IBUPROFEN 600 MG PO TABS
600.0000 mg | ORAL_TABLET | Freq: Once | ORAL | Status: DC
  Filled 2020-12-28: qty 1

## 2020-12-28 MED ORDER — DEXAMETHASONE SODIUM PHOSPHATE 10 MG/ML IJ SOLN
INTRAMUSCULAR | Status: DC | PRN
  Administered 2020-12-28: 4 mg via INTRAVENOUS

## 2020-12-28 MED ORDER — PROPOFOL 10 MG/ML IV BOLUS
INTRAVENOUS | Status: DC | PRN
  Administered 2020-12-28: 150 mg via INTRAVENOUS

## 2020-12-28 MED ORDER — IBUPROFEN 600 MG PO TABS
ORAL_TABLET | ORAL | Status: AC
  Filled 2020-12-28: qty 1

## 2020-12-28 MED ORDER — ONDANSETRON HCL 4 MG/2ML IJ SOLN
INTRAMUSCULAR | Status: DC | PRN
  Administered 2020-12-28: 4 mg via INTRAVENOUS

## 2020-12-28 MED ORDER — NEOMYCIN-POLYMYXIN B GU 40-200000 IR SOLN
Status: DC | PRN
  Administered 2020-12-28: 2 mL

## 2020-12-28 MED ORDER — LACTATED RINGERS IV SOLN: INTRAVENOUS | Status: DC

## 2020-12-28 MED ORDER — FENTANYL CITRATE (PF) 100 MCG/2ML IJ SOLN
INTRAMUSCULAR | Status: AC
  Filled 2020-12-28: qty 2

## 2020-12-28 MED ORDER — CHLORHEXIDINE GLUCONATE 0.12 % MT SOLN
15.0000 mL | Freq: Once | OROMUCOSAL | Status: AC
  Administered 2020-12-28: 15 mL via OROMUCOSAL

## 2020-12-28 MED ORDER — FENTANYL CITRATE (PF) 100 MCG/2ML IJ SOLN
INTRAMUSCULAR | Status: DC | PRN
  Administered 2020-12-28: 25 ug via INTRAVENOUS

## 2020-12-28 MED ORDER — DEXAMETHASONE SODIUM PHOSPHATE 10 MG/ML IJ SOLN
INTRAMUSCULAR | Status: AC
  Filled 2020-12-28: qty 1

## 2020-12-28 MED ORDER — ONDANSETRON HCL 4 MG PO TABS: 4.0000 mg | ORAL_TABLET | Freq: Four times a day (QID) | ORAL | Status: DC | PRN

## 2020-12-28 MED ORDER — BUPIVACAINE HCL (PF) 0.5 % IJ SOLN
INTRAMUSCULAR | Status: AC
  Filled 2020-12-28: qty 30

## 2020-12-28 MED ORDER — METOCLOPRAMIDE HCL 5 MG/ML IJ SOLN: 5.0000 mg | Freq: Three times a day (TID) | INTRAMUSCULAR | Status: DC | PRN

## 2020-12-28 SURGICAL SUPPLY — 31 items
APL PRP STRL LF DISP 70% ISPRP (MISCELLANEOUS) ×1
BNDG CMPR STD VLCR NS LF 5.8X3 (GAUZE/BANDAGES/DRESSINGS) ×1
BNDG ELASTIC 3X5.8 VLCR NS LF (GAUZE/BANDAGES/DRESSINGS) ×2 IMPLANT
CANISTER SUCT 1200ML W/VALVE (MISCELLANEOUS) ×2 IMPLANT
CAST PADDING 3X4FT ST 30246 (SOFTGOODS) ×1
CHLORAPREP W/TINT 26 (MISCELLANEOUS) ×2 IMPLANT
COVER WAND RF STERILE (DRAPES) ×2 IMPLANT
CUFF TOURN SGL QUICK 18X4 (TOURNIQUET CUFF) IMPLANT
ELECT REM PT RETURN 9FT ADLT (ELECTROSURGICAL) ×2
ELECTRODE REM PT RTRN 9FT ADLT (ELECTROSURGICAL) ×1 IMPLANT
GAUZE SPONGE 4X4 12PLY STRL (GAUZE/BANDAGES/DRESSINGS) ×2 IMPLANT
GAUZE XEROFORM 1X8 LF (GAUZE/BANDAGES/DRESSINGS) ×2 IMPLANT
GLOVE SURG SYN 9.0  PF PI (GLOVE) ×1
GLOVE SURG SYN 9.0 PF PI (GLOVE) ×1 IMPLANT
GOWN SRG 2XL LVL 4 RGLN SLV (GOWNS) ×1 IMPLANT
GOWN STRL NON-REIN 2XL LVL4 (GOWNS) ×2
GOWN STRL REUS W/ TWL LRG LVL3 (GOWN DISPOSABLE) ×1 IMPLANT
GOWN STRL REUS W/TWL LRG LVL3 (GOWN DISPOSABLE) ×2
KIT TURNOVER KIT A (KITS) ×2 IMPLANT
MANIFOLD NEPTUNE II (INSTRUMENTS) ×2 IMPLANT
NS IRRIG 500ML POUR BTL (IV SOLUTION) ×2 IMPLANT
PACK EXTREMITY ARMC (MISCELLANEOUS) ×2 IMPLANT
PAD CAST CTTN 3X4 STRL (SOFTGOODS) ×1 IMPLANT
PADDING CAST COTTON 3X4 STRL (SOFTGOODS) ×1
SCALPEL PROTECTED #15 DISP (BLADE) ×4 IMPLANT
SPLINT CAST 1 STEP 3X12 (MISCELLANEOUS) ×2 IMPLANT
SUT ETHILON 4-0 (SUTURE) ×2
SUT ETHILON 4-0 FS2 18XMFL BLK (SUTURE) ×1
SUT VIC AB 3-0 SH 27 (SUTURE) ×2
SUT VIC AB 3-0 SH 27X BRD (SUTURE) ×1 IMPLANT
SUTURE ETHLN 4-0 FS2 18XMF BLK (SUTURE) ×1 IMPLANT

## 2020-12-28 NOTE — H&P (Signed)
Chief Complaint  Patient presents with  . Follow-up  Lt wrist Dequervain's    History of the Present Illness: Ashley Soto is a 40 y.o. female here today.   The patient presents for evaluation of left wrist pain. The patient has previously seen Dr. Dorthula Nettles. She last saw Dr. Landry Mellow back in 01/2020 and had a de Quervain's injection. She comes back today for recheck.  The patient states her left wrist is still bothering her. She tells me the smallest bump to her left wrist is the most excruciating pain she has ever felt in her life. She tells me when she got her first steroid injection her left wrist due to her tendon snapping, and it has turned to it rolling. The first time it rolled the pain of it literally took her to her knees.  The patient is employed at [employer]. She recently got married and would like to start family planning next year, and would like to be able to pick up her children.  I have reviewed past medical, surgical, social and family history, and allergies as documented in the EMR.  Past Medical History: Past Medical History:  Diagnosis Date  . Bilateral temporomandibular joint disorder  . Cystic acne 11/25/2014  . History of substance abuse (CMS-HCC)  status post detoxification for Xanax abuse, May 2008, under the direction of Dr. Alycia Rossetti  . Seizures (CMS-HCC)   Past Surgical History: Past Surgical History:  Procedure Laterality Date  . TUBAL LIGATION   Past Family History: Family History  Problem Relation Age of Onset  . Depression Mother   Medications: Current Outpatient Medications Ordered in Epic  Medication Sig Dispense Refill  . diclofenac (VOLTAREN) 1 % topical gel Apply 2 g topically 4 (four) times daily as needed 100 g 1  . ketoconazole (NIZORAL) 2 % cream Apply topically 2 (two) times daily 15 g 1  . traZODone (DESYREL) 50 MG tablet TAKE 1 TO 2 TABLETS BY MOUTH AT BEDTIME AS NEEDED FOR SLEEP 180 tablet 3  .  venlafaxine (EFFEXOR-XR) 75 MG XR capsule TAKE 2 CAPSULES (150 MG TOTAL) BY MOUTH ONCE DAILY 180 capsule 2   No current Epic-ordered facility-administered medications on file.   Allergies: Allergies  Allergen Reactions  . Paxil [Paroxetine Hcl] Other (See Comments)  . Sulfa (Sulfonamide Antibiotics) Hives    Body mass index is 25.26 kg/m.  Review of Systems: A comprehensive 14 point ROS was performed, reviewed, and the pertinent orthopaedic findings are documented in the HPI.  Vitals:  12/08/20 0900  BP: 118/74    General Physical Examination:   General/Constitutional: No apparent distress: well-nourished and well developed. Eyes: Pupils equal, round with synchronous movement. Lungs: Clear to auscultation HEENT: Normal Vascular: No edema, swelling or tenderness, except as noted in detailed exam. Cardiac: Heart rate and rhythm is regular. Integumentary: No impressive skin lesions present, except as noted in detailed exam. Neuro/Psych: Normal mood and affect, oriented to person, place and time.  Musculoskeletal Examination:  On exam, significant left wrist edema. Unable to flex the left wrist.  Radiographs:  No new imaging studies were obtained or reviewed today.  Assessment: ICD-10-CM  1. De Quervain's tenosynovitis M65.4   Plan:  The patient has clinical findings of left wrist de Quervain tenosynovitis and pain.  We discussed the patient's prior x-Latka findings. I explained she has a left wrist de Quervain tenosynovitis. I recommend release in the office under local anesthesia. I explained the surgery and postoperative course in  detail. The patient would like to undergo the procedure in the hospital, which is reasonable.   We will get insurance authorization for surgery.  Surgical Risks:  The nature of the condition and the proposed procedure has been reviewed in detail with the patient. Surgical versus non-surgical options and prognosis for recovery have been  reviewed and the inherent risks and benefits of each have been discussed including the risks of infection, bleeding, injury to nerves/blood vessels/tendons, incomplete relief of symptoms, persisting pain and/or stiffness, loss of function, complex regional pain syndrome, failure of the procedure, as appropriate.  Attestation: I, Dawn Royse, am documenting for [employer], MD utilizing Nuance DAX.     Electronically signed by Marlena Clipper, MD at 12/09/2020 6:30 PM EST   Reviewed  H+P. No changes noted.

## 2020-12-28 NOTE — Anesthesia Preprocedure Evaluation (Signed)
Anesthesia Evaluation  Patient identified by MRN, date of birth, ID band Patient awake    Reviewed: Allergy & Precautions, H&P , NPO status , Patient's Chart, lab work & pertinent test results  History of Anesthesia Complications Negative for: history of anesthetic complications  Airway Mallampati: II  TM Distance: >3 FB Neck ROM: full    Dental  (+) Poor Dentition, Dental Advidsory Given   Pulmonary neg shortness of breath, neg recent URI, Current Smoker and Patient abstained from smoking.,    Pulmonary exam normal breath sounds clear to auscultation       Cardiovascular Exercise Tolerance: Good (-) hypertensionnegative cardio ROS Normal cardiovascular exam Rhythm:regular Rate:Normal     Neuro/Psych  Headaches, Seizures -,  PSYCHIATRIC DISORDERS Anxiety Depression    GI/Hepatic negative GI ROS, Neg liver ROS,   Endo/Other  negative endocrine ROS  Renal/GU negative Renal ROS  negative genitourinary   Musculoskeletal   Abdominal   Peds  Hematology negative hematology ROS (+)   Anesthesia Other Findings Past Medical History: No date: Anxiety and depression No date: History of seizures No date: History of substance abuse No date: Polycystic disease, ovaries No date: Seizures (HCC) No date: Tobacco abuse  Past Surgical History: No date: DILATION AND CURETTAGE OF UTERUS 03/19/2016: DILATION AND CURETTAGE OF UTERUS N/A     Comment: Procedure: SUCTION DILATATION AND CURETTAGE;                Surgeon: [place] Bing, MD;  Location: [facility]               ORS;  Service: Gynecology;  Laterality: N/A; No date: TONSILLECTOMY  BMI    Body Mass Index:  24.96 kg/m      Reproductive/Obstetrics negative OB ROS                             Anesthesia Physical  Anesthesia Plan  ASA: III  Anesthesia Plan: General   Post-op Pain Management:    Induction: Intravenous  PONV Risk Score and  Plan: 2 and Ondansetron, Dexamethasone, Midazolam, Promethazine and Treatment may vary due to age or medical condition  Airway Management Planned: LMA  Additional Equipment:   Intra-op Plan:   Post-operative Plan: Extubation in OR  Informed Consent: I have reviewed the patients History and Physical, chart, labs and discussed the procedure including the risks, benefits and alternatives for the proposed anesthesia with the patient or authorized representative who has indicated his/her understanding and acceptance.       Plan Discussed with: Anesthesiologist  Anesthesia Plan Comments:         Anesthesia Quick Evaluation

## 2020-12-28 NOTE — Transfer of Care (Cosign Needed)
Immediate Anesthesia Transfer of Care Note  Patient: Ashley Soto  Procedure(s) Performed: Suzette Battiest release, left (Left )  Patient Location: PACU  Anesthesia Type:General  Level of Consciousness: awake  Airway & Oxygen Therapy: Patient Spontanous Breathing  Post-op Assessment: Report given to RN  Post vital signs: stable  Last Vitals:  Vitals Value Taken Time  BP 112/51 12/28/20 0936  Temp 36 C 12/28/20 0936  Pulse 61 12/28/20 0944  Resp 14 12/28/20 0944  SpO2 100 % 12/28/20 0944  Vitals shown include unvalidated device data.  Last Pain:  Vitals:   12/28/20 0936  TempSrc:   PainSc: 0-No pain         Complications: No complications documented.

## 2020-12-28 NOTE — Anesthesia Postprocedure Evaluation (Signed)
Anesthesia Post Note  Patient: Ashley Soto  Procedure(s) Performed: Suzette Battiest release, left (Left )  Patient location during evaluation: PACU Anesthesia Type: General Level of consciousness: awake and alert Pain management: pain level controlled Vital Signs Assessment: post-procedure vital signs reviewed and stable Respiratory status: spontaneous breathing, nonlabored ventilation, respiratory function stable and patient connected to nasal cannula oxygen Cardiovascular status: blood pressure returned to baseline and stable Postop Assessment: no apparent nausea or vomiting Anesthetic complications: no   No complications documented.   Last Vitals:  Vitals:   12/28/20 1016 12/28/20 1048  BP: 125/71 (P) 114/69  Pulse: (!) 51 (P) 60  Resp: 16 (P) 16  Temp: 36.4 C   SpO2: 100%     Last Pain:  Vitals:   12/28/20 1016  TempSrc: Temporal  PainSc: 3                  Lenard Simmer

## 2020-12-28 NOTE — Op Note (Signed)
12/28/2020  9:36 AM  PATIENT:  Ashley Soto  40 y.o. female  PRE-OPERATIVE DIAGNOSIS:  De Quervain's tenosynovitis M65.4  POST-OPERATIVE DIAGNOSIS:  De Quervain's tenosynovitis M65.4  PROCEDURE:  Procedure(s): De Quervain's release, left (Left)  SURGEON: Leitha Schuller, MD  ASSISTANTS: None  ANESTHESIA:   general  EBL:  Total I/O In: 100 [IV Piggyback:100] Out: 1 [Blood:1]  BLOOD ADMINISTERED:none  DRAINS: none   LOCAL MEDICATIONS USED:  MARCAINE     SPECIMEN:  No Specimen  DISPOSITION OF SPECIMEN:  N/A  COUNTS:  YES  TOURNIQUET: 10 minutes left upper arm at 250 mmHg  IMPLANTS: None  DICTATION: .Dragon Dictation patient was brought to the operating room and after adequate anesthesia was obtained the left arm was prepped and draped in the usual sterile fashion.  After patient identification and timeout procedures were completed tourniquet was raised.  A transverse incision made over the radial styloid where there was a large swollen area.  This subcutaneous tissue was spread preserving veins and subcutaneous branches of the superficial radial nerve.  The area of compression was identified and incised and there is moderate flexor extensor tenosynovitis present.  Release was carried out carefully distally and then proximally care being taken to separate adjacent tissue.  After complete release and all tendon slips identified and released the thumb was placed through range of motion and there did not appear to be any further impingement present.  The wound was then irrigated and closed with simple erupted 4-0 nylon skin sutures followed by infiltration of 5 cc of half percent Sensorcaine just proximal to the incision.  Dressing of Xeroform 4 x 4 web roll and Ace wrap applied  PLAN OF CARE: Discharge to home after PACU  PATIENT DISPOSITION:  PACU - hemodynamically stable.

## 2020-12-28 NOTE — Anesthesia Procedure Notes (Cosign Needed)
Procedure Name: LMA Insertion Date/Time: 12/28/2020 9:01 AM Performed by: Archie Patten, RN Pre-anesthesia Checklist: Patient identified, Patient being monitored, Timeout performed, Suction available and Emergency Drugs available Patient Re-evaluated:Patient Re-evaluated prior to induction Oxygen Delivery Method: Circle system utilized Preoxygenation: Pre-oxygenation with 100% oxygen Induction Type: Combination inhalational/ intravenous induction Ventilation: Mask ventilation without difficulty LMA: LMA inserted LMA Size: 4.0

## 2020-12-28 NOTE — Discharge Instructions (Addendum)
     AMBULATORY SURGERY  DISCHARGE INSTRUCTIONS   1) The drugs that you were given will stay in your system until tomorrow so for the next 24 hours you should not:  A) Drive an automobile B) Make any legal decisions C) Drink any alcoholic beverage   2) You may resume regular meals tomorrow.  Today it is better to start with liquids and gradually work up to solid foods.  You may eat anything you prefer, but it is better to start with liquids, then soup and crackers, and gradually work up to solid foods.   3) Please notify your doctor immediately if you have any unusual bleeding, trouble breathing, redness and pain at the surgery site, drainage, fever, or pain not relieved by medication.    4) Additional Instructions:        Please contact your physician with any problems or Same Day Surgery at [phone], Monday through Friday 6 am to 4 pm, or [place] at [facility] number at [phone].Okay to use hand is much as you feel comfortable. Loosen Ace wrap if fingers swell but leave underlying cotton wrap alone. Keep dressing clean and dry. Tylenol or Advil as needed for pain. Call office if you are having problems.

## 2021-02-10 ENCOUNTER — Ambulatory Visit: Payer: Commercial Managed Care - PPO | Admitting: Obstetrics and Gynecology

## 2021-02-11 ENCOUNTER — Other Ambulatory Visit: Payer: Self-pay

## 2021-02-11 ENCOUNTER — Encounter: Payer: Self-pay | Admitting: Obstetrics and Gynecology

## 2021-02-11 ENCOUNTER — Ambulatory Visit (INDEPENDENT_AMBULATORY_CARE_PROVIDER_SITE_OTHER): Payer: Commercial Managed Care - PPO | Admitting: Obstetrics and Gynecology

## 2021-02-11 VITALS — BP 122/74 | Ht 65.0 in | Wt 155.0 lb

## 2021-02-11 DIAGNOSIS — Z3046 Encounter for surveillance of implantable subdermal contraceptive: Secondary | ICD-10-CM | POA: Diagnosis not present

## 2021-02-11 DIAGNOSIS — Z1331 Encounter for screening for depression: Secondary | ICD-10-CM | POA: Diagnosis not present

## 2021-02-11 DIAGNOSIS — Z1339 Encounter for screening examination for other mental health and behavioral disorders: Secondary | ICD-10-CM | POA: Diagnosis not present

## 2021-02-11 DIAGNOSIS — Z01419 Encounter for gynecological examination (general) (routine) without abnormal findings: Secondary | ICD-10-CM

## 2021-02-11 NOTE — Addendum Note (Signed)
Addended by: Thomasene Mohair D on: 02/11/2021 04:18 PM   Modules accepted: Orders

## 2021-02-11 NOTE — Progress Notes (Addendum)
Gynecology Annual Exam  PCP: Leighton Ruff  Chief Complaint  Patient presents with  . Annual Exam    Pt wants to remove nexplanon and try to get pregnant   History of Present Illness:  [name] is a 40 y.o. G3P0020 who LMP was No LMP recorded. Patient has had an implant., presents today for her annual examination.  Her menses are nearly absent with her implant.  She wants to get pregnant.    She is sexually active.  She has no issues with intercourse.  Last Pap: 10/2018  Results were: no abnormalities /neg HPV DNA negative  The states that she had cryotherapy for some abnormalities of her cervix about 15 years ago. Hx of STDs: distant history   There is a FH of breast cancer in her mother (age 61) and her paternal grandmother at age 77. There is no FH of ovarian cancer.   Tobacco use: plans on quitting.  Alcohol use: none Exercise: her job, otherwise none  The patient wears seatbelts: yes.   The patient reports that domestic violence in her life is absent.   Past Medical History:  Diagnosis Date  . Anxiety and depression   . History of seizures     D/T  BENZODIAZEPINE WITHDRAWAL. only had one seizure  . History of substance abuse (HCC)    ALCOHOL AND DRUG FREE FOR 4 YEARS.  INVOLVED WITH AA  . Polycystic disease, ovaries   . Tinea corporis   . Tobacco abuse     Past Surgical History:  Procedure Laterality Date  . DILATION AND CURETTAGE OF UTERUS    . DILATION AND CURETTAGE OF UTERUS N/A 03/19/2016   Procedure: SUCTION DILATATION AND CURETTAGE;  Surgeon: [place] Bing, MD;  Location: [facility];  Service: Gynecology;  Laterality: N/A;  . DORSAL COMPARTMENT RELEASE Left 12/28/2020   Procedure: Suzette Battiest release, left;  Surgeon: Kennedy Bucker, MD;  Location: [facility];  Service: Orthopedics;  Laterality: Left;  . TONSILLECTOMY      Prior to Admission medications   Medication Sig Start Date End Date Taking? Authorizing Provider  Wellbutrin 150 mg daily Trazodone  as needed for sleep.  Allergies  Allergen Reactions  . Paroxetine Hcl     Suicidal thoughts   . Sulfa Antibiotics Hives   Obstetric History: [id number]  Social History   Socioeconomic History  . Marital status: Married    Spouse name: Casimiro Needle  . Number of children: 0  . Years of education: Not on file  . Highest education level: Not on file  Occupational History  . Occupation: Environmental education officer  Tobacco Use  . Smoking status: Current Every Day Smoker    Packs/day: 0.50    Types: Cigarettes  . Smokeless tobacco: Never Used  Vaping Use  . Vaping Use: Never used  Substance and Sexual Activity  . Alcohol use: Not Currently    Comment: ALCOHOL FREE X 4 YEARS  . Drug use: No    Comment: DRUG FREE X 4 YEARS  . Sexual activity: Yes  Other Topics Concern  . Not on file  Social History Narrative   Patient lives with husband. Patient feels safe in her home.   Social Determinants of Health   Financial Resource Strain: Not on file  Food Insecurity: Not on file  Transportation Needs: Not on file  Physical Activity: Not on file  Stress: Not on file  Social Connections: Not on file  Intimate Partner Violence: Not on file    Family  History  Problem Relation Age of Onset  . Breast cancer Mother        early 37s  . Breast cancer Paternal Grandmother        early 33s    Review of Systems  Constitutional: Negative.   HENT: Negative.   Eyes: Negative.   Respiratory: Negative.   Cardiovascular: Negative.   Gastrointestinal: Negative.   Genitourinary: Negative.   Musculoskeletal: Negative.   Skin: Negative.   Neurological: Negative.   Psychiatric/Behavioral: Negative.      Physical Exam BP 122/74   Ht 5\' 5"  (1.651 m)   Wt 155 lb (70.3 kg)   BMI 25.79 kg/m    Physical Exam Constitutional:      General: She is not in acute distress.    Appearance: Normal appearance. She is well-developed.  Genitourinary:     Vulva and bladder normal.     Right Labia: No  rash, tenderness, lesions, skin changes or Bartholin's cyst.    Left Labia: No tenderness, lesions, skin changes, Bartholin's cyst or rash.    No inguinal adenopathy present in the right or left side.    Pelvic Tanner Score: 5/5.    No vaginal discharge, erythema, tenderness or bleeding.      Right Adnexa: not tender, not full and no mass present.    Left Adnexa: not tender, not full and no mass present.    No cervical motion tenderness, discharge, lesion or polyp.     Uterus is not enlarged or tender.     No uterine mass detected.    Pelvic exam was performed with patient in the lithotomy position.  Breasts:     Right: No inverted nipple, mass, nipple discharge, skin change or tenderness.     Left: No inverted nipple, mass, nipple discharge, skin change or tenderness.    HENT:     Head: Normocephalic and atraumatic.  Eyes:     General: No scleral icterus.    Conjunctiva/sclera: Conjunctivae normal.  Neck:     Thyroid: No thyromegaly.  Cardiovascular:     Rate and Rhythm: Normal rate and regular rhythm.     Heart sounds: No murmur heard. No friction rub. No gallop.   Pulmonary:     Effort: Pulmonary effort is normal. No respiratory distress.     Breath sounds: Normal breath sounds. No wheezing or rales.  Abdominal:     General: Bowel sounds are normal. There is no distension.     Palpations: Abdomen is soft. There is no mass.     Tenderness: There is no abdominal tenderness. There is no guarding or rebound.     Hernia: There is no hernia in the left inguinal area or right inguinal area.  Musculoskeletal:        General: No swelling or tenderness. Normal range of motion.     Cervical back: Normal range of motion and neck supple.  Lymphadenopathy:     Cervical: No cervical adenopathy.     Lower Body: No right inguinal adenopathy. No left inguinal adenopathy.  Neurological:     General: No focal deficit present.     Mental Status: She is alert and oriented to person, place,  and time.     Cranial Nerves: No cranial nerve deficit.  Skin:    General: Skin is warm and dry.     Findings: No erythema or rash.  Psychiatric:        Mood and Affect: Mood normal.  Behavior: Behavior normal.        Judgment: Judgment normal.   Female chaperone present for pelvic and breast  portions of the physical exam    GYNECOLOGY PROCEDURE NOTE  Nexplanon removal discussed in detail.  Risks of infection, bleeding, nerve injury all reviewed.  Patient understands risks and desires to proceed.  Verbal consent obtained.  Patient is certain she wants the Nexplanon removed.  All questions answered.  Procedure: Patient placed in dorsal supine with left arm above head, elbow flexed at 90 degrees, arm resting on examination table.  Nexplanon identified without problems.  Betadine scrub x3.  1 ml of 1% lidocaine injected under Nexplanondevice without problems.  Sterile gloves applied.  Small 0.5cm incision made at distal tip of Nexplanon device with 11 blade scalpel.  Nexplanon brought to incision and grasped with a small kelly clamp.  Nexplanon removed intact without problems.  Pressure applied to incision.  Hemostasis obtained.  Steri-strips applied, followed by bandage and compression dressing.  Patient tolerated procedure well.  No complications.  Results: AUDIT Questionnaire (screen for alcoholism): 0 PHQ-9: 4   Assessment: 40 y.o. G5P0020 female here for routine annual gynecologic examination  Plan: Problem List Items Addressed This Visit   None   Visit Diagnoses    Women's annual routine gynecological examination    -  Primary   Screening for depression       Screening for alcoholism       Nexplanon removal         Screening: -- Blood pressure screen normal -- Weight screening: normal -- Depression screening negative (PHQ-9) -- Nutrition: normal -- cholesterol screening: not due for screening -- osteoporosis screening: not due -- tobacco screening: not using --  alcohol screening: AUDIT questionnaire indicates low-risk usage. -- family history of breast cancer screening: done. not at high risk. -- no evidence of domestic violence or intimate partner violence. -- STD screening: gonorrhea/chlamydia NAAT not collected per patient request. -- pap smear not collected per ASCCP guidelines  Thomasene Mohair, MD 02/11/2021 4:18 PM

## 2021-03-21 ENCOUNTER — Telehealth: Payer: Self-pay

## 2021-03-21 NOTE — Telephone Encounter (Signed)
Pt calling; has appt Fri for NOB; had miscarriage over the weekend; not sure if needs to be seen sooner.  [phone]  Adv okay to wait for appt Fri as long as not doubled over with the cramping or saturating a pad q38min-1hr. Pt aware to go to ED if she has bleeding that heavy.

## 2021-03-25 ENCOUNTER — Encounter: Payer: Self-pay | Admitting: Obstetrics and Gynecology

## 2021-03-25 ENCOUNTER — Ambulatory Visit (INDEPENDENT_AMBULATORY_CARE_PROVIDER_SITE_OTHER): Payer: BC Managed Care – PPO | Admitting: Obstetrics and Gynecology

## 2021-03-25 ENCOUNTER — Other Ambulatory Visit: Payer: Self-pay

## 2021-03-25 VITALS — BP 122/74 | Ht 65.0 in | Wt 155.0 lb

## 2021-03-25 DIAGNOSIS — N96 Recurrent pregnancy loss: Secondary | ICD-10-CM | POA: Diagnosis not present

## 2021-03-25 NOTE — Progress Notes (Signed)
Obstetrics & Gynecology Office Visit   Chief Complaint  Patient presents with  . Follow-up    History of Present Illness: 40 y.o. G94P0030 female who presents in follow up for a miscarriage.  She had her Nexplanon removed in February.  Before she had a period she found out she was pregnant.  She had a positive pregnancy test 3/19.   This past Saturday, she started spotting and cramping. This escalated to more bleeding and cramping. A subsequent home pregnancy test was negative.  She has had 3 miscarriages in her life and no successful pregnancies.     Past Medical History:  Diagnosis Date  . Anxiety and depression   . History of seizures     D/T  BENZODIAZEPINE WITHDRAWAL. only had one seizure  . History of substance abuse (HCC)    ALCOHOL AND DRUG FREE FOR 4 YEARS.  INVOLVED WITH AA  . Polycystic disease, ovaries   . Tinea corporis   . Tobacco abuse     Past Surgical History:  Procedure Laterality Date  . DILATION AND CURETTAGE OF UTERUS    . DILATION AND CURETTAGE OF UTERUS N/A 03/19/2016   Procedure: SUCTION DILATATION AND CURETTAGE;  Surgeon: [place] Bing, MD;  Location: [facility];  Service: Gynecology;  Laterality: N/A;  . DORSAL COMPARTMENT RELEASE Left 12/28/2020   Procedure: Suzette Battiest release, left;  Surgeon: Kennedy Bucker, MD;  Location: [facility];  Service: Orthopedics;  Laterality: Left;  . TONSILLECTOMY      Gynecologic History: No LMP recorded. Patient has had an implant.  Obstetric History: G3P0030, s/p SAB x 3.   Family History  Problem Relation Age of Onset  . Breast cancer Mother        early 88s  . Breast cancer Paternal Grandmother        early 23s    Social History   Socioeconomic History  . Marital status: Married    Spouse name: Casimiro Needle  . Number of children: 0  . Years of education: Not on file  . Highest education level: Not on file  Occupational History  . Occupation: Environmental education officer  Tobacco Use  . Smoking status: Current  Every Day Smoker    Packs/day: 0.50    Types: Cigarettes  . Smokeless tobacco: Never Used  Vaping Use  . Vaping Use: Never used  Substance and Sexual Activity  . Alcohol use: Not Currently    Comment: ALCOHOL FREE X 4 YEARS  . Drug use: No    Comment: DRUG FREE X 4 YEARS  . Sexual activity: Yes  Other Topics Concern  . Not on file  Social History Narrative   Patient lives with husband. Patient feels safe in her home.   Social Determinants of Health   Financial Resource Strain: Not on file  Food Insecurity: Not on file  Transportation Needs: Not on file  Physical Activity: Not on file  Stress: Not on file  Social Connections: Not on file  Intimate Partner Violence: Not on file    Allergies  Allergen Reactions  . Paroxetine Hcl     Suicidal thoughts   . Sulfa Antibiotics Hives    Prior to Admission medications: Denies     Review of Systems  Constitutional: Negative.   HENT: Negative.   Eyes: Negative.   Respiratory: Negative.   Cardiovascular: Negative.   Gastrointestinal: Negative.   Genitourinary: Negative.   Musculoskeletal: Negative.   Skin: Negative.   Neurological: Negative.   Psychiatric/Behavioral: Negative.  Physical Exam BP 122/74   Ht 5\' 5"  (1.651 m)   Wt 155 lb (70.3 kg)   BMI 25.79 kg/m  No LMP recorded. Patient has had an implant. Physical Exam Constitutional:      General: She is not in acute distress.    Appearance: Normal appearance.  HENT:     Head: Normocephalic and atraumatic.  Eyes:     General: No scleral icterus.    Conjunctiva/sclera: Conjunctivae normal.  Neurological:     General: No focal deficit present.     Mental Status: She is alert and oriented to person, place, and time.     Cranial Nerves: No cranial nerve deficit.  Psychiatric:        Mood and Affect: Mood normal.        Behavior: Behavior normal.        Judgment: Judgment normal.     Female chaperone present for pelvic and breast  portions of the  physical exam  Assessment: 40 y.o. G52P0020 female here for  1. History of recurrent miscarriages      Plan: Problem List Items Addressed This Visit   None   Visit Diagnoses    History of recurrent miscarriages    -  Primary   Relevant Orders   TSH   Hemoglobin A1c   Prolactin   Beta-2-glycoprotein i abs, IgG/M/A   Cardiolipin antibodies, IgG, IgM, IgA   Lupus anticoagulant     Discussed workup for recurrent pregnancy loss. Will perform ASRM based guidelines with the exception of a karyotype.  I reiterated that even with no findings the chance of successful pregnancy is still 60-70%.  All questions answered.  A total of 24 minutes were spent face-to-face with the patient as well as preparation, review, communication, and documentation during this encounter.    G1P141, MD 03/25/2021 4:46 PM

## 2021-03-29 ENCOUNTER — Other Ambulatory Visit: Payer: BC Managed Care – PPO

## 2021-06-13 ENCOUNTER — Other Ambulatory Visit (HOSPITAL_COMMUNITY)
Admission: RE | Admit: 2021-06-13 | Discharge: 2021-06-13 | Disposition: A | Discharge status: Home / Self Care | Payer: BC Managed Care – PPO | Source: Ambulatory Visit | Attending: Obstetrics and Gynecology | Admitting: Obstetrics and Gynecology

## 2021-06-13 ENCOUNTER — Ambulatory Visit (INDEPENDENT_AMBULATORY_CARE_PROVIDER_SITE_OTHER): Payer: BC Managed Care – PPO | Admitting: Obstetrics and Gynecology

## 2021-06-13 ENCOUNTER — Other Ambulatory Visit: Payer: Self-pay

## 2021-06-13 ENCOUNTER — Encounter: Payer: Self-pay | Admitting: Obstetrics and Gynecology

## 2021-06-13 VITALS — BP 118/72 | Ht 65.0 in | Wt 162.8 lb

## 2021-06-13 DIAGNOSIS — O30041 Twin pregnancy, dichorionic/diamniotic, first trimester: Secondary | ICD-10-CM | POA: Diagnosis not present

## 2021-06-13 DIAGNOSIS — O0990 Supervision of high risk pregnancy, unspecified, unspecified trimester: Secondary | ICD-10-CM | POA: Insufficient documentation

## 2021-06-13 DIAGNOSIS — O0991 Supervision of high risk pregnancy, unspecified, first trimester: Secondary | ICD-10-CM | POA: Diagnosis not present

## 2021-06-13 DIAGNOSIS — N926 Irregular menstruation, unspecified: Secondary | ICD-10-CM | POA: Diagnosis not present

## 2021-06-13 DIAGNOSIS — Z113 Encounter for screening for infections with a predominantly sexual mode of transmission: Secondary | ICD-10-CM | POA: Insufficient documentation

## 2021-06-13 DIAGNOSIS — O30049 Twin pregnancy, dichorionic/diamniotic, unspecified trimester: Secondary | ICD-10-CM | POA: Insufficient documentation

## 2021-06-13 DIAGNOSIS — Z3A01 Less than 8 weeks gestation of pregnancy: Secondary | ICD-10-CM

## 2021-06-13 DIAGNOSIS — O09519 Supervision of elderly primigravida, unspecified trimester: Secondary | ICD-10-CM | POA: Insufficient documentation

## 2021-06-13 DIAGNOSIS — O09511 Supervision of elderly primigravida, first trimester: Secondary | ICD-10-CM

## 2021-06-13 LAB — POCT URINALYSIS DIPSTICK OB
Glucose, UA: NEGATIVE
POC,PROTEIN,UA: NEGATIVE

## 2021-06-13 LAB — POCT URINE PREGNANCY: Preg Test, Ur: POSITIVE — AB

## 2021-06-13 NOTE — Progress Notes (Signed)
New Obstetric Patient H&P   Chief Complaint: "Desires prenatal care"  History of Present Illness: Patient is a 40 y.o. [phone] Not Hispanic or Latino female, LMP unsure (maybe end of April) presents with amenorrhea and positive home pregnancy test. Based on her  LMP, her EDD is unsure and her EGA is Unknown. Her last pap smear was 2 or 3 years ago and was no abnormalities.    She had a urine pregnancy test which was positive 4 week(s)  ago. Since her LMP she claims she has experienced no issues. She has had dark spotting that is occasional. Her past medical history is notable for recurrent pregnancy loss, seizures, history of substance use.   Since her LMP, she admits to the use of tobacco products  yes. Down to 5-7 cigs/day. She claims she has gained 8 pounds since the start of her pregnancy.  There are cats in the home in the home  yes If yes Indoor She admits close contact with children on a regular basis  no  She has had chicken pox in the past yes She has had Tuberculosis exposures, symptoms, or previously tested positive for TB   no Current or past history of domestic violence. no  Genetic Screening/Teratology Counseling: (Includes patient, baby's father, or anyone in either family with:)   1. Patient's age >/= 41 at [facility]  yes 2. Thalassemia ([place], [place], Mediterranean, or Asian background): MCV<80  no 3. Neural tube defect (meningomyelocele, spina bifida, anencephaly)  no 4. Congenital heart defect  no  5. Down syndrome  no 6. Tay-Sachs (Jewish, [place]  no 7. Canavan's Disease  no 8. Sickle cell disease or trait (African)  no  9. Hemophilia or other blood disorders  no  10. Muscular dystrophy  no  11. Cystic fibrosis  no  12. Huntington's Chorea  no  13. Mental retardation/autism  no 14. Other inherited genetic or chromosomal disorder  no 15. Maternal metabolic disorder (DM, PKU, etc)  no 16. Patient or FOB with a child with a birth defect not listed above no   16a. Patient or FOB with a birth defect themselves no 17. Recurrent pregnancy loss, or stillbirth  yes  18. Any medications since LMP other than prenatal vitamins (include vitamins, supplements, OTC meds, drugs, alcohol)  no 19. Any other genetic/environmental exposure to discuss  no  Infection History:   1. Lives with someone with TB or TB exposed  no  2. Patient or partner has history of genital herpes  no 3. Rash or viral illness since LMP  no 4. History of STI (GC, CT, HPV, syphilis, HIV)  no 5. History of recent travel :  no  Other pertinent information:  no     Review of Systems:10 point review of systems negative unless otherwise noted in HPI  Past Medical History:  Diagnosis Date   Anxiety and depression    History of seizures     D/T  BENZODIAZEPINE WITHDRAWAL. only had one seizure   History of substance abuse (HCC)    ALCOHOL AND DRUG FREE FOR 4 YEARS.  INVOLVED WITH AA   Polycystic disease, ovaries    Tinea corporis    Tobacco abuse     Past Surgical History:  Procedure Laterality Date   DILATION AND CURETTAGE OF UTERUS     DILATION AND CURETTAGE OF UTERUS N/A 03/19/2016   Procedure: SUCTION DILATATION AND CURETTAGE;  Surgeon: [place] Bing, MD;  Location: [facility];  Service: Gynecology;  Laterality: N/A;  DORSAL COMPARTMENT RELEASE Left 12/28/2020   Procedure: Tommi Rumps Quervain's release, left;  Surgeon: Kennedy Bucker, MD;  Location: [facility];  Service: Orthopedics;  Laterality: Left;   TONSILLECTOMY      Gynecologic History: No LMP recorded (lmp unknown). Patient is pregnant.  Obstetric History: G4P0020  Family History  Problem Relation Age of Onset   Breast cancer Mother        early 29s   Breast cancer Paternal Grandmother        early 6s    Social History   Socioeconomic History   Marital status: Married    Spouse name: Casimiro Needle   Number of children: 0   Years of education: Not on file   Highest education level: Not on file  Occupational  History   Occupation: Environmental education officer  Tobacco Use   Smoking status: Every Day    Packs/day: 0.50    Pack years: 0.00    Types: Cigarettes   Smokeless tobacco: Never  Vaping Use   Vaping Use: Never used  Substance and Sexual Activity   Alcohol use: Not Currently    Comment: ALCOHOL FREE X 4 YEARS   Drug use: No    Comment: DRUG FREE X 4 YEARS   Sexual activity: Yes  Other Topics Concern   Not on file  Social History Narrative   Patient lives with husband. Patient feels safe in her home.   Social Determinants of Health   Financial Resource Strain: Not on file  Food Insecurity: Not on file  Transportation Needs: Not on file  Physical Activity: Not on file  Stress: Not on file  Social Connections: Not on file  Intimate Partner Violence: Not on file    Allergies  Allergen Reactions   Paroxetine Hcl     Suicidal thoughts    Sulfa Antibiotics Hives    Prior to Admission medications: PNV, trazodone for sleep QHS    Physical Exam BP 118/72   Ht 5\' 5"  (1.651 m)   Wt 162 lb 12.8 oz (73.8 kg)   LMP  (LMP Unknown)   BMI 27.09 kg/m   Physical Exam Constitutional:      General: She is not in acute distress.    Appearance: Normal appearance. She is well-developed.  Genitourinary:     Vulva, bladder and urethral meatus normal.     No lesions in the vagina.     Right Labia: No rash, tenderness, lesions, skin changes or Bartholin's cyst.    Left Labia: No tenderness, lesions, skin changes, Bartholin's cyst or rash.    No inguinal adenopathy present in the right or left side.    Pelvic Tanner Score: 5/5.    No vaginal discharge, erythema, bleeding or ulceration.     No vaginal prolapse present.     Right Adnexa: not tender, not full and no mass present.    Left Adnexa: not tender, not full and no mass present.    No cervical motion tenderness, discharge, friability, lesion or polyp.     Cervical exam comments: Small smudge of old dark blood on q-tip.     Uterus  is not enlarged, fixed or tender.     Uterus is anteverted.     No urethral tenderness or mass present.     Pelvic exam was performed with patient in the lithotomy position.  HENT:     Head: Normocephalic and atraumatic.  Eyes:     General: No scleral icterus.    Conjunctiva/sclera: Conjunctivae normal.  Cardiovascular:  Rate and Rhythm: Normal rate and regular rhythm.     Heart sounds: No murmur heard.   No friction rub. No gallop.  Pulmonary:     Effort: Pulmonary effort is normal. No respiratory distress.     Breath sounds: Normal breath sounds. No wheezing or rales.  Abdominal:     General: Bowel sounds are normal. There is no distension.     Palpations: Abdomen is soft. There is no mass.     Tenderness: There is no abdominal tenderness. There is no guarding or rebound.     Hernia: There is no hernia in the left inguinal area or right inguinal area.  Musculoskeletal:        General: Normal range of motion.     Cervical back: Normal range of motion and neck supple.  Lymphadenopathy:     Lower Body: No right inguinal adenopathy. No left inguinal adenopathy.  Neurological:     General: No focal deficit present.     Mental Status: She is alert and oriented to person, place, and time.     Cranial Nerves: No cranial nerve deficit.  Skin:    General: Skin is warm and dry.     Findings: No erythema.  Psychiatric:        Mood and Affect: Mood normal.        Behavior: Behavior normal.        Judgment: Judgment normal.    Female Chaperone present during breast and/or pelvic exam.  Bedside transabdominal ultrasound shows viable twin intrauterine gestation.  Difficult exam due to acoustic difficulties. However, appears to be a di-di twin gestation. Cardiac activity present in both twins.     Assessment: 40 y.o. G4P0020 at Unknown presenting to initiate prenatal care  Plan: 1) Avoid alcoholic beverages. 2) Patient encouraged not to smoke.  3) Discontinue the use of all  non-medicinal drugs and chemicals.  4) Take prenatal vitamins daily.  5) Nutrition, food safety (fish, cheese advisories, and high nitrite foods) and exercise discussed. 6) Hospital and practice style discussed with cross coverage system.  7) Genetic Screening, such as with 1st Trimester Screening, cell free fetal DNA, AFP testing, and Ultrasound, as well as with amniocentesis and CVS as appropriate, is discussed with patient. At the conclusion of today's visit patient undecided genetic testing 8) Patient is asked about travel to areas at risk for the [place] virus, and counseled to avoid travel and exposure to mosquitoes or sexual partners who may have themselves been exposed to the virus. Testing is discussed, and will be ordered as appropriate.  9) U/s soon for dating and twin designation.   Thomasene Mohair, MD 06/13/2021 2:25 PM

## 2021-06-15 LAB — CERVICOVAGINAL ANCILLARY ONLY
Chlamydia: NEGATIVE
Comment: NEGATIVE
Comment: NORMAL
Neisseria Gonorrhea: NEGATIVE

## 2021-06-29 DIAGNOSIS — O209 Hemorrhage in early pregnancy, unspecified: Secondary | ICD-10-CM | POA: Diagnosis not present

## 2021-06-29 DIAGNOSIS — O09521 Supervision of elderly multigravida, first trimester: Secondary | ICD-10-CM | POA: Diagnosis not present

## 2021-06-29 DIAGNOSIS — Z3A10 10 weeks gestation of pregnancy: Secondary | ICD-10-CM | POA: Diagnosis not present

## 2021-06-29 DIAGNOSIS — O30001 Twin pregnancy, unspecified number of placenta and unspecified number of amniotic sacs, first trimester: Secondary | ICD-10-CM | POA: Diagnosis not present

## 2021-06-29 DIAGNOSIS — Z882 Allergy status to sulfonamides status: Secondary | ICD-10-CM | POA: Diagnosis not present

## 2021-07-01 ENCOUNTER — Other Ambulatory Visit: Payer: Self-pay

## 2021-07-01 ENCOUNTER — Ambulatory Visit (INDEPENDENT_AMBULATORY_CARE_PROVIDER_SITE_OTHER): Payer: BC Managed Care – PPO | Admitting: Obstetrics and Gynecology

## 2021-07-01 ENCOUNTER — Encounter: Payer: Self-pay | Admitting: Obstetrics and Gynecology

## 2021-07-01 VITALS — BP 120/80 | Wt 157.0 lb

## 2021-07-01 DIAGNOSIS — O0991 Supervision of high risk pregnancy, unspecified, first trimester: Secondary | ICD-10-CM | POA: Diagnosis not present

## 2021-07-01 DIAGNOSIS — O30041 Twin pregnancy, dichorionic/diamniotic, first trimester: Secondary | ICD-10-CM | POA: Diagnosis not present

## 2021-07-01 DIAGNOSIS — O09511 Supervision of elderly primigravida, first trimester: Secondary | ICD-10-CM

## 2021-07-01 DIAGNOSIS — Z3A10 10 weeks gestation of pregnancy: Secondary | ICD-10-CM

## 2021-07-01 DIAGNOSIS — Z1379 Encounter for other screening for genetic and chromosomal anomalies: Secondary | ICD-10-CM | POA: Diagnosis not present

## 2021-07-01 NOTE — Progress Notes (Signed)
ULTRASOUND REPORT  Location: [facility] Date of Service: 07/01/2021   Indications:dating (Transabdominal) Findings:  Dichorionic diamniotic intrauterine pregnancy is visualized with a CRL consistent with [redacted]w[redacted]d gestation, giving an (U/S) EDD of 01/24/2022.  Baby A:  FHR: 175 BPM CRL measurement: 38.9 mm Yolk sac is visualized and appears normal. Amnion: not visualized   Baby B:  FHR: 173 BPM CRL measurement: 35.1 mm Yolk sac is visualized and appears normal. Amnion: not visualized   Right Ovary is normal in appearance. Left Ovary is normal appearance. Corpus luteal cyst:  is not visualized Survey of the adnexa demonstrates no adnexal masses. There is no free peritoneal fluid in the cul de sac.  Impression: 1. [redacted]w[redacted]d Viable Dichorionic diamniotic Intrauterine pregnancy by U/S. 2. (U/S) EDD is consistent with Clinically established EDD of 01/24/2022.  There is a viable dichorionic diamniotic twin gestation.  Detailed evaluation of the fetal anatomy is precluded by early gestational age.  It must be noted that a normal ultrasound particular at this early gestational age is unable to rule out fetal aneuploidy, risk of first trimester miscarriage, or anatomic birth defects.  I personally performed the ultrasound and interpreted the images.  Thomasene Mohair, MD, [facility], [facility] 07/01/2021 1:02 PM

## 2021-07-01 NOTE — Progress Notes (Signed)
  Routine Prenatal Care Visit  Subjective  Ashley Soto is a 40 y.o. G4P0020 at [redacted]w[redacted]d being seen today for ongoing prenatal care.  She is currently monitored for the following issues for this high-risk pregnancy and has Active preterm labor; Preterm labor; Anxiety state; Bilateral temporomandibular joint disorder; Cystic acne; History of seizure; History of substance abuse (HCC); Primary insomnia; Supervision of high risk pregnancy, antepartum; Advanced maternal age, primigravida; and Twin gestation in first trimester on their problem list.  ----------------------------------------------------------------------------------- Patient reports no complaints.    . Vag. Bleeding: None.   . Leaking Fluid denies.  Dating u/s today gives EDD 01/24/2022, appears to be di-di twins. See report for details under NOTES ----------------------------------------------------------------------------------- The following portions of the patient's history were reviewed and updated as appropriate: allergies, current medications, past family history, past medical history, past social history, past surgical history and problem list. Problem list updated.  Objective  Blood pressure 120/80, weight 157 lb (71.2 kg). Pregravid weight 148 lb (67.1 kg) Total Weight Gain 9 lb (4.082 kg) Urinalysis: Urine Protein    Urine Glucose    Fetal Status: Fetal Heart Rate (bpm): 175/173         General:  Alert, oriented and cooperative. Patient is in no acute distress.  Skin: Skin is warm and dry. No rash noted.   Cardiovascular: Normal heart rate noted  Respiratory: Normal respiratory effort, no problems with respiration noted  Abdomen: Soft, gravid, appropriate for gestational age. Pain/Pressure: Absent     Pelvic:  Cervical exam deferred        Extremities: Normal range of motion.     Mental Status: Normal mood and affect. Normal behavior. Normal judgment and thought content.   Assessment   40 y.o. G4P0020 at [redacted]w[redacted]d by   01/24/2022, by Ultrasound presenting for routine prenatal visit  Plan   pregnancy 5 Problems (from 06/13/21 to present)     Problem Noted Resolved   Supervision of high risk pregnancy, antepartum 06/13/2021 by Conard Novak, MD No   Advanced maternal age, primigravida 06/13/2021 by Conard Novak, MD No   Twin gestation in first trimester 06/13/2021 by Conard Novak, MD No   Overview Signed 06/13/2021  4:17 PM by Conard Novak, MD    Undetermined twin type. Most likely di-di            Preterm labor symptoms and general obstetric precautions including but not limited to vaginal bleeding, contractions, leaking of fluid and fetal movement were reviewed in detail with the patient. Please refer to After Visit Summary for other counseling recommendations.   -labs today  Return in about 4 weeks (around 07/29/2021) for Routine Prenatal Appointment.   Thomasene Mohair, MD, [facility], [facility] 07/01/2021 12:58 PM

## 2021-07-01 NOTE — Progress Notes (Signed)
ULTRASOUND REPORT  Location: [facility] Date of Service: 07/01/2021   Indications:dating (Transabdominal) Findings:  Dichorionic diamniotic intrauterine pregnancy is visualized with a CRL consistent with [redacted]w[redacted]d gestation, giving an (U/S) EDD of 01/24/2022.  Baby A:  FHR: 175 BPM CRL measurement: 38.9 mm Yolk sac is visualized and appears normal. Amnion: not visualized   Baby B:  FHR: 173 BPM CRL measurement: 35.1 mm Yolk sac is visualized and appears normal. Amnion: not visualized   Right Ovary is normal in appearance. Left Ovary is normal appearance. Corpus luteal cyst:  is not visualized Survey of the adnexa demonstrates no adnexal masses. There is no free peritoneal fluid in the cul de sac.  Impression: 1. [redacted]w[redacted]d Viable Dichorionic diamniotic Intrauterine pregnancy by U/S. 2. (U/S) EDD is consistent with Clinically established EDD of 01/24/2022.  There is a viable dichorionic diamniotic twin gestation.  Detailed evaluation of the fetal anatomy is precluded by early gestational age.  It must be noted that a normal ultrasound particular at this early gestational age is unable to rule out fetal aneuploidy, risk of first trimester miscarriage, or anatomic birth defects.  I personally performed the ultrasound and interpreted the images.  Corian Handley, MD, FACOG [facility], [facility] 07/01/2021 1:02 PM    

## 2021-07-02 LAB — RPR+RH+ABO+RUB AB+AB SCR+CB...
Antibody Screen: NEGATIVE
HIV Screen 4th Generation wRfx: NONREACTIVE
Hematocrit: 41.7 % (ref 34.0–46.6)
Hemoglobin: 14.1 g/dL (ref 11.1–15.9)
Hepatitis B Surface Ag: NEGATIVE
MCH: 30.7 pg (ref 26.6–33.0)
MCHC: 33.8 g/dL (ref 31.5–35.7)
MCV: 91 fL (ref 79–97)
Platelets: 277 10*3/uL (ref 150–450)
RBC: 4.6 x10E6/uL (ref 3.77–5.28)
RDW: 12.4 % (ref 11.7–15.4)
RPR Ser Ql: NONREACTIVE
Rh Factor: POSITIVE
Rubella Antibodies, IGG: 6.56 index (ref >0.99)
Varicella zoster IgG: 2259 index (ref >165)
WBC: 12.5 10*3/uL — ABNORMAL HIGH (ref 3.4–10.8)

## 2021-07-02 LAB — HGB A1C W/O EAG: Hgb A1c MFr Bld: 5.3 % (ref 4.8–5.6)

## 2021-07-03 LAB — URINE CULTURE

## 2021-07-08 LAB — MATERNIT21  PLUS CORE+ESS, BLOOD
11q23 deletion (Jacobsen): NOT DETECTED
15q11 deletion (PW Angelman): NOT DETECTED
1p36 deletion syndrome: NOT DETECTED
22q11 deletion (DiGeorge): NOT DETECTED
4p16 deletion(Wolf-Hirschhorn): NOT DETECTED
5p15 deletion (Cri-du-chat): NOT DETECTED
8q24 deletion (Langer-Giedion): NOT DETECTED
Fetal Fraction: 11
Result (T21): NEGATIVE
Trisomy 13 (Patau syndrome): NEGATIVE
Trisomy 16: NOT DETECTED
Trisomy 18 (Edwards syndrome): NEGATIVE
Trisomy 21 (Down syndrome): NEGATIVE
Trisomy 22: NOT DETECTED

## 2021-07-22 ENCOUNTER — Encounter: Payer: BC Managed Care – PPO | Admitting: Obstetrics and Gynecology

## 2021-07-26 ENCOUNTER — Ambulatory Visit (INDEPENDENT_AMBULATORY_CARE_PROVIDER_SITE_OTHER): Payer: BC Managed Care – PPO | Admitting: Obstetrics and Gynecology

## 2021-07-26 ENCOUNTER — Encounter: Payer: Self-pay | Admitting: Obstetrics and Gynecology

## 2021-07-26 ENCOUNTER — Other Ambulatory Visit: Payer: Self-pay

## 2021-07-26 VITALS — BP 120/70 | Wt 157.0 lb

## 2021-07-26 DIAGNOSIS — O0990 Supervision of high risk pregnancy, unspecified, unspecified trimester: Secondary | ICD-10-CM

## 2021-07-26 DIAGNOSIS — Z3A14 14 weeks gestation of pregnancy: Secondary | ICD-10-CM

## 2021-07-26 DIAGNOSIS — O30042 Twin pregnancy, dichorionic/diamniotic, second trimester: Secondary | ICD-10-CM

## 2021-07-26 DIAGNOSIS — O09512 Supervision of elderly primigravida, second trimester: Secondary | ICD-10-CM

## 2021-07-26 LAB — POCT URINALYSIS DIPSTICK OB
Glucose, UA: NEGATIVE
POC,PROTEIN,UA: NEGATIVE

## 2021-07-26 NOTE — Progress Notes (Signed)
  Routine Prenatal Care Visit  Subjective  Ashley Soto is a 40 y.o. G4P0020 at [redacted]w[redacted]d being seen today for ongoing prenatal care.  She is currently monitored for the following issues for this high-risk pregnancy and has Active preterm labor; Preterm labor; Anxiety state; Bilateral temporomandibular joint disorder; Cystic acne; History of seizure; History of substance abuse (HCC); Primary insomnia; Supervision of high risk pregnancy, antepartum; Advanced maternal age, primigravida; and Dichorionic diamniotic twin pregnancy on their problem list.  ----------------------------------------------------------------------------------- Patient reports no complaints.    . Vag. Bleeding: None.  Movement: Absent. Leaking Fluid denies.  ----------------------------------------------------------------------------------- The following portions of the patient's history were reviewed and updated as appropriate: allergies, current medications, past family history, past medical history, past social history, past surgical history and problem list. Problem list updated.  Objective  Blood pressure 120/70, weight 157 lb (71.2 kg). Pregravid weight 148 lb (67.1 kg) Total Weight Gain 9 lb (4.082 kg) Urinalysis: Urine Protein Negative  Urine Glucose Negative  Fetal Status: Fetal Heart Rate (bpm): 148/150 ([place])   Movement: Absent     General:  Alert, oriented and cooperative. Patient is in no acute distress.  Skin: Skin is warm and dry. No rash noted.   Cardiovascular: Normal heart rate noted  Respiratory: Normal respiratory effort, no problems with respiration noted  Abdomen: Soft, gravid, appropriate for gestational age. Pain/Pressure: Absent     Pelvic:  Cervical exam deferred        Extremities: Normal range of motion.     Mental Status: Normal mood and affect. Normal behavior. Normal judgment and thought content.   Assessment   40 y.o. G4P0020 at [redacted]w[redacted]d by  01/24/2022, by Ultrasound presenting for routine prenatal  visit  Plan   pregnancy 5 Problems (from 06/13/21 to present)     Problem Noted Resolved   Supervision of high risk pregnancy, antepartum 06/13/2021 by Conard Novak, MD No   Advanced maternal age, primigravida 06/13/2021 by Conard Novak, MD No   Dichorionic diamniotic twin pregnancy 06/13/2021 by Conard Novak, MD No   Overview Signed 06/13/2021  4:17 PM by Conard Novak, MD    Undetermined twin type. Most likely di-di            Preterm labor symptoms and general obstetric precautions including but not limited to vaginal bleeding, contractions, leaking of fluid and fetal movement were reviewed in detail with the patient. Please refer to After Visit Summary for other counseling recommendations.   - Start bASA - MFM anatomy u/s arranged.   Return in about 4 weeks (around 08/23/2021) for Routine Prenatal Appointment.   Thomasene Mohair, MD, [facility], [facility] 07/26/2021 4:33 PM

## 2021-08-25 ENCOUNTER — Encounter: Payer: BC Managed Care – PPO | Admitting: Obstetrics and Gynecology

## 2021-08-30 ENCOUNTER — Ambulatory Visit: Payer: BC Managed Care – PPO | Attending: Maternal & Fetal Medicine

## 2021-08-30 ENCOUNTER — Other Ambulatory Visit: Payer: Self-pay | Admitting: Maternal & Fetal Medicine

## 2021-08-30 ENCOUNTER — Other Ambulatory Visit: Payer: Self-pay | Admitting: Obstetrics and Gynecology

## 2021-08-30 ENCOUNTER — Ambulatory Visit (HOSPITAL_BASED_OUTPATIENT_CLINIC_OR_DEPARTMENT_OTHER): Payer: BC Managed Care – PPO | Admitting: Maternal & Fetal Medicine

## 2021-08-30 ENCOUNTER — Other Ambulatory Visit: Payer: Self-pay

## 2021-08-30 VITALS — BP 111/66 | HR 85 | Temp 98.3°F | Ht 65.0 in | Wt 161.5 lb

## 2021-08-30 DIAGNOSIS — O0992 Supervision of high risk pregnancy, unspecified, second trimester: Secondary | ICD-10-CM

## 2021-08-30 DIAGNOSIS — O99332 Smoking (tobacco) complicating pregnancy, second trimester: Secondary | ICD-10-CM

## 2021-08-30 DIAGNOSIS — Z3A31 31 weeks gestation of pregnancy: Secondary | ICD-10-CM | POA: Diagnosis not present

## 2021-08-30 DIAGNOSIS — O26872 Cervical shortening, second trimester: Secondary | ICD-10-CM | POA: Diagnosis not present

## 2021-08-30 DIAGNOSIS — O26879 Cervical shortening, unspecified trimester: Secondary | ICD-10-CM

## 2021-08-30 DIAGNOSIS — O09212 Supervision of pregnancy with history of pre-term labor, second trimester: Secondary | ICD-10-CM | POA: Diagnosis not present

## 2021-08-30 DIAGNOSIS — O30042 Twin pregnancy, dichorionic/diamniotic, second trimester: Secondary | ICD-10-CM | POA: Diagnosis not present

## 2021-08-30 DIAGNOSIS — O3433 Maternal care for cervical incompetence, third trimester: Secondary | ICD-10-CM

## 2021-08-30 DIAGNOSIS — Z3A19 19 weeks gestation of pregnancy: Secondary | ICD-10-CM | POA: Diagnosis not present

## 2021-08-30 DIAGNOSIS — O09522 Supervision of elderly multigravida, second trimester: Secondary | ICD-10-CM | POA: Diagnosis not present

## 2021-08-30 DIAGNOSIS — O09292 Supervision of pregnancy with other poor reproductive or obstetric history, second trimester: Secondary | ICD-10-CM | POA: Diagnosis not present

## 2021-08-30 DIAGNOSIS — O3432 Maternal care for cervical incompetence, second trimester: Secondary | ICD-10-CM

## 2021-08-30 DIAGNOSIS — O30043 Twin pregnancy, dichorionic/diamniotic, third trimester: Secondary | ICD-10-CM | POA: Diagnosis not present

## 2021-08-30 DIAGNOSIS — O09512 Supervision of elderly primigravida, second trimester: Secondary | ICD-10-CM

## 2021-08-30 DIAGNOSIS — O0990 Supervision of high risk pregnancy, unspecified, unspecified trimester: Secondary | ICD-10-CM

## 2021-08-30 MED ORDER — PROGESTERONE 200 MG VA SUPP: 200.0000 mg | Freq: Every day | VAGINAL | 6 refills | Status: DC

## 2021-08-30 NOTE — Progress Notes (Signed)
MFM Consultation  Diamniotic Dichorionic pregnancy at an advanced gestational age of [redacted] weeks.  Ms. Ayuso is a G4P0 who is here for a detailed examination for DiDi twin pregnancy. Normal anatomy with good amniotic fluid and fetal movement was observed in Twin A and B. Suboptimal views of the fetal anatomy were obtained secondary to fetal position and multiple gestation. Twin discordance of 3%.  I reviewed the normal nature of today's ultrasound. Ms. Criscuolo conveyed that she is taking low dose aspirin for preeclampsia prevention.  She had a low risk NIPS and negative horizon.   We reviewed the sonographic findings and limitations of ultrasound. The potential risks associated with a twin gestation were discussed.  This discussion included a review of the increased risk of miscarriages, anomalies, preterm labor, and/or delivery, malpresentation, delivery via cesarean section, gestational diabetes, and/or preeclampsia.  With regards to fetal risks, there is an increased risk for fetal growth restriction of one or both twins, preterm labor, and associated morbidity, and intrauterine fetal demise.    We recommend growth scans every 4 weeks starting at 24 weeks with the initation of weekly antenatal testing in the form of twice weekly NST or weekly BPP should abnormal fetal growth or intertwin discordance of greater than 20-25% is noted.    In addition, Ms Pilling has a prior 21 weeks 4 day loss that presented with sudden labor and delivery. She delivered vaginally without complication. She was not aware that she was pregnant.  She was counseled about future pregnancy.   She presented early during the pregnancy. She is not currently on progesterone therapy.  Today we observe a cervical length of ~45mm. The cervix was dynamic but no evidence of dilation. Ms. Veillon conveyed that she has been asymptomatic with no signs or symptoms of preterm labor.   We discussed dtoday's findings of a shortened cervix concerning  for cervical incompetence. We discussed that there is an increased risk for preterm birth. We discussed that vaginal progesterone has been the predominant approach to management of shortened cervix in twins as a metanalysis suggested that ultrasound indicated cerlage placed in twins was associated with higher risk of low birth weight. We discussed that this data has influence provider preference for cerclage placement in twins. however, given than her cervix is <5 mm, I explained that an option for cerclage is reasonable. After discussion of today's study she desired to initiate vaginal progesterone today and obtain a second opinion.   She was scheduled to return in 1 week for cervical length assessment.   Preterm labor precautions were discussed.  I discussed with her provider Dr. Jerene Pitch with [facility] who is sending in a prescription and plan to contact [facility] and [facility] to discuss further.  I spent 30 minutes with > 50% in face to face consultation.  Novella Olive, MD.   ADDENDUM: I discussed this case with [facility] and [facility]. They both offered further evaluation on L&D. After discussion with Ms. Harshberger over the phone she opted to go to [facility] for further evaluation.

## 2021-08-30 NOTE — Progress Notes (Signed)
Rx for progesterone sent per MFM.

## 2021-08-31 ENCOUNTER — Encounter: Payer: BC Managed Care – PPO | Admitting: Obstetrics and Gynecology

## 2021-08-31 DIAGNOSIS — Z3A19 19 weeks gestation of pregnancy: Secondary | ICD-10-CM | POA: Diagnosis not present

## 2021-08-31 DIAGNOSIS — O30042 Twin pregnancy, dichorionic/diamniotic, second trimester: Secondary | ICD-10-CM | POA: Diagnosis not present

## 2021-08-31 DIAGNOSIS — O09292 Supervision of pregnancy with other poor reproductive or obstetric history, second trimester: Secondary | ICD-10-CM | POA: Diagnosis not present

## 2021-08-31 DIAGNOSIS — O09212 Supervision of pregnancy with history of pre-term labor, second trimester: Secondary | ICD-10-CM | POA: Diagnosis not present

## 2021-08-31 DIAGNOSIS — O30043 Twin pregnancy, dichorionic/diamniotic, third trimester: Secondary | ICD-10-CM | POA: Diagnosis not present

## 2021-08-31 DIAGNOSIS — O3432 Maternal care for cervical incompetence, second trimester: Secondary | ICD-10-CM | POA: Diagnosis not present

## 2021-08-31 DIAGNOSIS — O3431 Maternal care for cervical incompetence, first trimester: Secondary | ICD-10-CM | POA: Diagnosis not present

## 2021-08-31 DIAGNOSIS — O26872 Cervical shortening, second trimester: Secondary | ICD-10-CM | POA: Diagnosis not present

## 2021-08-31 DIAGNOSIS — O09522 Supervision of elderly multigravida, second trimester: Secondary | ICD-10-CM | POA: Diagnosis not present

## 2021-09-01 ENCOUNTER — Other Ambulatory Visit: Payer: Self-pay

## 2021-09-01 DIAGNOSIS — O30042 Twin pregnancy, dichorionic/diamniotic, second trimester: Secondary | ICD-10-CM

## 2021-09-01 DIAGNOSIS — O3432 Maternal care for cervical incompetence, second trimester: Secondary | ICD-10-CM

## 2021-09-06 ENCOUNTER — Other Ambulatory Visit: Payer: BC Managed Care – PPO

## 2021-09-07 DIAGNOSIS — O09512 Supervision of elderly primigravida, second trimester: Secondary | ICD-10-CM | POA: Diagnosis not present

## 2021-09-07 DIAGNOSIS — F419 Anxiety disorder, unspecified: Secondary | ICD-10-CM | POA: Diagnosis not present

## 2021-09-07 DIAGNOSIS — O99322 Drug use complicating pregnancy, second trimester: Secondary | ICD-10-CM | POA: Diagnosis not present

## 2021-09-07 DIAGNOSIS — F1911 Other psychoactive substance abuse, in remission: Secondary | ICD-10-CM | POA: Diagnosis not present

## 2021-09-07 DIAGNOSIS — Z3A24 24 weeks gestation of pregnancy: Secondary | ICD-10-CM | POA: Diagnosis not present

## 2021-09-07 DIAGNOSIS — O321XX1 Maternal care for breech presentation, fetus 1: Secondary | ICD-10-CM | POA: Diagnosis not present

## 2021-09-07 DIAGNOSIS — O3432 Maternal care for cervical incompetence, second trimester: Secondary | ICD-10-CM | POA: Diagnosis not present

## 2021-09-07 DIAGNOSIS — O365922 Maternal care for other known or suspected poor fetal growth, second trimester, fetus 2: Secondary | ICD-10-CM | POA: Diagnosis not present

## 2021-09-07 DIAGNOSIS — F411 Generalized anxiety disorder: Secondary | ICD-10-CM | POA: Diagnosis not present

## 2021-09-07 DIAGNOSIS — F17200 Nicotine dependence, unspecified, uncomplicated: Secondary | ICD-10-CM | POA: Diagnosis not present

## 2021-09-07 DIAGNOSIS — O99342 Other mental disorders complicating pregnancy, second trimester: Secondary | ICD-10-CM | POA: Diagnosis not present

## 2021-09-07 DIAGNOSIS — O09522 Supervision of elderly multigravida, second trimester: Secondary | ICD-10-CM | POA: Diagnosis not present

## 2021-09-07 DIAGNOSIS — O09292 Supervision of pregnancy with other poor reproductive or obstetric history, second trimester: Secondary | ICD-10-CM | POA: Diagnosis not present

## 2021-09-07 DIAGNOSIS — O30042 Twin pregnancy, dichorionic/diamniotic, second trimester: Secondary | ICD-10-CM | POA: Diagnosis not present

## 2021-09-07 DIAGNOSIS — O26872 Cervical shortening, second trimester: Secondary | ICD-10-CM | POA: Diagnosis not present

## 2021-09-07 DIAGNOSIS — O322XX2 Maternal care for transverse and oblique lie, fetus 2: Secondary | ICD-10-CM | POA: Diagnosis not present

## 2021-09-07 DIAGNOSIS — F17210 Nicotine dependence, cigarettes, uncomplicated: Secondary | ICD-10-CM | POA: Diagnosis not present

## 2021-09-07 DIAGNOSIS — Z23 Encounter for immunization: Secondary | ICD-10-CM | POA: Diagnosis not present

## 2021-09-07 DIAGNOSIS — O98812 Other maternal infectious and parasitic diseases complicating pregnancy, second trimester: Secondary | ICD-10-CM | POA: Diagnosis not present

## 2021-09-07 DIAGNOSIS — O99332 Smoking (tobacco) complicating pregnancy, second trimester: Secondary | ICD-10-CM | POA: Diagnosis not present

## 2021-09-07 DIAGNOSIS — O4102X1 Oligohydramnios, second trimester, fetus 1: Secondary | ICD-10-CM | POA: Diagnosis not present

## 2021-09-07 DIAGNOSIS — B379 Candidiasis, unspecified: Secondary | ICD-10-CM | POA: Diagnosis not present

## 2021-09-07 DIAGNOSIS — O26892 Other specified pregnancy related conditions, second trimester: Secondary | ICD-10-CM | POA: Diagnosis not present

## 2021-09-07 DIAGNOSIS — Z8659 Personal history of other mental and behavioral disorders: Secondary | ICD-10-CM | POA: Diagnosis not present

## 2021-09-07 DIAGNOSIS — Z3689 Encounter for other specified antenatal screening: Secondary | ICD-10-CM | POA: Diagnosis not present

## 2021-09-07 DIAGNOSIS — Z3A20 20 weeks gestation of pregnancy: Secondary | ICD-10-CM | POA: Diagnosis not present

## 2021-09-07 DIAGNOSIS — Z8759 Personal history of other complications of pregnancy, childbirth and the puerperium: Secondary | ICD-10-CM | POA: Diagnosis not present

## 2021-09-14 DIAGNOSIS — O3432 Maternal care for cervical incompetence, second trimester: Secondary | ICD-10-CM | POA: Diagnosis not present

## 2021-09-14 DIAGNOSIS — O26872 Cervical shortening, second trimester: Secondary | ICD-10-CM | POA: Diagnosis not present

## 2021-09-14 DIAGNOSIS — F411 Generalized anxiety disorder: Secondary | ICD-10-CM | POA: Diagnosis not present

## 2021-09-14 DIAGNOSIS — O30042 Twin pregnancy, dichorionic/diamniotic, second trimester: Secondary | ICD-10-CM | POA: Diagnosis not present

## 2021-09-27 ENCOUNTER — Other Ambulatory Visit: Payer: Self-pay | Admitting: Obstetrics and Gynecology

## 2021-09-27 DIAGNOSIS — O26872 Cervical shortening, second trimester: Secondary | ICD-10-CM

## 2021-10-06 DIAGNOSIS — O09292 Supervision of pregnancy with other poor reproductive or obstetric history, second trimester: Secondary | ICD-10-CM | POA: Diagnosis not present

## 2021-10-06 DIAGNOSIS — Z3A24 24 weeks gestation of pregnancy: Secondary | ICD-10-CM | POA: Diagnosis not present

## 2021-10-06 DIAGNOSIS — O3432 Maternal care for cervical incompetence, second trimester: Secondary | ICD-10-CM | POA: Diagnosis not present

## 2021-10-06 DIAGNOSIS — O99322 Drug use complicating pregnancy, second trimester: Secondary | ICD-10-CM | POA: Diagnosis not present

## 2021-10-06 DIAGNOSIS — O365922 Maternal care for other known or suspected poor fetal growth, second trimester, fetus 2: Secondary | ICD-10-CM | POA: Diagnosis not present

## 2021-10-06 DIAGNOSIS — F411 Generalized anxiety disorder: Secondary | ICD-10-CM | POA: Diagnosis not present

## 2021-10-06 DIAGNOSIS — Z8759 Personal history of other complications of pregnancy, childbirth and the puerperium: Secondary | ICD-10-CM | POA: Diagnosis not present

## 2021-10-06 DIAGNOSIS — F1911 Other psychoactive substance abuse, in remission: Secondary | ICD-10-CM | POA: Diagnosis not present

## 2021-10-06 DIAGNOSIS — O321XX1 Maternal care for breech presentation, fetus 1: Secondary | ICD-10-CM | POA: Diagnosis not present

## 2021-10-06 DIAGNOSIS — O99342 Other mental disorders complicating pregnancy, second trimester: Secondary | ICD-10-CM | POA: Diagnosis not present

## 2021-10-06 DIAGNOSIS — Z3689 Encounter for other specified antenatal screening: Secondary | ICD-10-CM | POA: Diagnosis not present

## 2021-10-06 DIAGNOSIS — O09522 Supervision of elderly multigravida, second trimester: Secondary | ICD-10-CM | POA: Diagnosis not present

## 2021-10-06 DIAGNOSIS — F17200 Nicotine dependence, unspecified, uncomplicated: Secondary | ICD-10-CM | POA: Diagnosis not present

## 2021-10-06 DIAGNOSIS — O30042 Twin pregnancy, dichorionic/diamniotic, second trimester: Secondary | ICD-10-CM | POA: Diagnosis not present

## 2021-10-06 DIAGNOSIS — O322XX2 Maternal care for transverse and oblique lie, fetus 2: Secondary | ICD-10-CM | POA: Diagnosis not present

## 2021-10-06 DIAGNOSIS — O99332 Smoking (tobacco) complicating pregnancy, second trimester: Secondary | ICD-10-CM | POA: Diagnosis not present

## 2021-10-13 DIAGNOSIS — O365920 Maternal care for other known or suspected poor fetal growth, second trimester, not applicable or unspecified: Secondary | ICD-10-CM | POA: Diagnosis not present

## 2021-10-13 DIAGNOSIS — O365922 Maternal care for other known or suspected poor fetal growth, second trimester, fetus 2: Secondary | ICD-10-CM | POA: Diagnosis not present

## 2021-10-13 DIAGNOSIS — O321XX1 Maternal care for breech presentation, fetus 1: Secondary | ICD-10-CM | POA: Diagnosis not present

## 2021-10-13 DIAGNOSIS — F17200 Nicotine dependence, unspecified, uncomplicated: Secondary | ICD-10-CM | POA: Diagnosis not present

## 2021-10-13 DIAGNOSIS — O09212 Supervision of pregnancy with history of pre-term labor, second trimester: Secondary | ICD-10-CM | POA: Diagnosis not present

## 2021-10-13 DIAGNOSIS — O09292 Supervision of pregnancy with other poor reproductive or obstetric history, second trimester: Secondary | ICD-10-CM | POA: Diagnosis not present

## 2021-10-13 DIAGNOSIS — Z3A25 25 weeks gestation of pregnancy: Secondary | ICD-10-CM | POA: Diagnosis not present

## 2021-10-13 DIAGNOSIS — O99332 Smoking (tobacco) complicating pregnancy, second trimester: Secondary | ICD-10-CM | POA: Diagnosis not present

## 2021-10-13 DIAGNOSIS — Z3689 Encounter for other specified antenatal screening: Secondary | ICD-10-CM | POA: Diagnosis not present

## 2021-10-13 DIAGNOSIS — O321XX2 Maternal care for breech presentation, fetus 2: Secondary | ICD-10-CM | POA: Diagnosis not present

## 2021-10-13 DIAGNOSIS — O30042 Twin pregnancy, dichorionic/diamniotic, second trimester: Secondary | ICD-10-CM | POA: Diagnosis not present

## 2021-10-13 DIAGNOSIS — O3432 Maternal care for cervical incompetence, second trimester: Secondary | ICD-10-CM | POA: Diagnosis not present

## 2021-10-13 DIAGNOSIS — O09522 Supervision of elderly multigravida, second trimester: Secondary | ICD-10-CM | POA: Diagnosis not present

## 2021-10-20 DIAGNOSIS — F17200 Nicotine dependence, unspecified, uncomplicated: Secondary | ICD-10-CM | POA: Diagnosis not present

## 2021-10-20 DIAGNOSIS — O321XX1 Maternal care for breech presentation, fetus 1: Secondary | ICD-10-CM | POA: Diagnosis not present

## 2021-10-20 DIAGNOSIS — F1911 Other psychoactive substance abuse, in remission: Secondary | ICD-10-CM | POA: Diagnosis not present

## 2021-10-20 DIAGNOSIS — O30042 Twin pregnancy, dichorionic/diamniotic, second trimester: Secondary | ICD-10-CM | POA: Diagnosis not present

## 2021-10-20 DIAGNOSIS — O322XX2 Maternal care for transverse and oblique lie, fetus 2: Secondary | ICD-10-CM | POA: Diagnosis not present

## 2021-10-20 DIAGNOSIS — Z3A26 26 weeks gestation of pregnancy: Secondary | ICD-10-CM | POA: Diagnosis not present

## 2021-10-20 DIAGNOSIS — O4102X1 Oligohydramnios, second trimester, fetus 1: Secondary | ICD-10-CM | POA: Diagnosis not present

## 2021-10-20 DIAGNOSIS — Z3689 Encounter for other specified antenatal screening: Secondary | ICD-10-CM | POA: Diagnosis not present

## 2021-10-20 DIAGNOSIS — O09292 Supervision of pregnancy with other poor reproductive or obstetric history, second trimester: Secondary | ICD-10-CM | POA: Diagnosis not present

## 2021-10-20 DIAGNOSIS — O09522 Supervision of elderly multigravida, second trimester: Secondary | ICD-10-CM | POA: Diagnosis not present

## 2021-10-20 DIAGNOSIS — O3432 Maternal care for cervical incompetence, second trimester: Secondary | ICD-10-CM | POA: Diagnosis not present

## 2021-10-20 DIAGNOSIS — O99332 Smoking (tobacco) complicating pregnancy, second trimester: Secondary | ICD-10-CM | POA: Diagnosis not present

## 2021-10-20 DIAGNOSIS — Z79890 Hormone replacement therapy (postmenopausal): Secondary | ICD-10-CM | POA: Diagnosis not present

## 2021-10-20 DIAGNOSIS — Z8759 Personal history of other complications of pregnancy, childbirth and the puerperium: Secondary | ICD-10-CM | POA: Diagnosis not present

## 2021-10-20 DIAGNOSIS — O99322 Drug use complicating pregnancy, second trimester: Secondary | ICD-10-CM | POA: Diagnosis not present

## 2021-10-27 DIAGNOSIS — Z362 Encounter for other antenatal screening follow-up: Secondary | ICD-10-CM | POA: Diagnosis not present

## 2021-10-27 DIAGNOSIS — O365922 Maternal care for other known or suspected poor fetal growth, second trimester, fetus 2: Secondary | ICD-10-CM | POA: Diagnosis not present

## 2021-10-27 DIAGNOSIS — F17200 Nicotine dependence, unspecified, uncomplicated: Secondary | ICD-10-CM | POA: Diagnosis not present

## 2021-10-27 DIAGNOSIS — O99332 Smoking (tobacco) complicating pregnancy, second trimester: Secondary | ICD-10-CM | POA: Diagnosis not present

## 2021-10-27 DIAGNOSIS — O4102X1 Oligohydramnios, second trimester, fetus 1: Secondary | ICD-10-CM | POA: Diagnosis not present

## 2021-10-27 DIAGNOSIS — O09292 Supervision of pregnancy with other poor reproductive or obstetric history, second trimester: Secondary | ICD-10-CM | POA: Diagnosis not present

## 2021-10-27 DIAGNOSIS — O09522 Supervision of elderly multigravida, second trimester: Secondary | ICD-10-CM | POA: Diagnosis not present

## 2021-10-27 DIAGNOSIS — O321XX1 Maternal care for breech presentation, fetus 1: Secondary | ICD-10-CM | POA: Diagnosis not present

## 2021-10-27 DIAGNOSIS — O365920 Maternal care for other known or suspected poor fetal growth, second trimester, not applicable or unspecified: Secondary | ICD-10-CM | POA: Diagnosis not present

## 2021-10-27 DIAGNOSIS — O3432 Maternal care for cervical incompetence, second trimester: Secondary | ICD-10-CM | POA: Diagnosis not present

## 2021-10-27 DIAGNOSIS — Z3A27 27 weeks gestation of pregnancy: Secondary | ICD-10-CM | POA: Diagnosis not present

## 2021-10-27 DIAGNOSIS — O30042 Twin pregnancy, dichorionic/diamniotic, second trimester: Secondary | ICD-10-CM | POA: Diagnosis not present

## 2021-10-27 DIAGNOSIS — O322XX2 Maternal care for transverse and oblique lie, fetus 2: Secondary | ICD-10-CM | POA: Diagnosis not present

## 2021-11-02 DIAGNOSIS — O99335 Smoking (tobacco) complicating the puerperium: Secondary | ICD-10-CM | POA: Diagnosis not present

## 2021-11-02 DIAGNOSIS — O43893 Other placental disorders, third trimester: Secondary | ICD-10-CM | POA: Diagnosis not present

## 2021-11-02 DIAGNOSIS — Z3A28 28 weeks gestation of pregnancy: Secondary | ICD-10-CM | POA: Diagnosis not present

## 2021-11-02 DIAGNOSIS — Z051 Observation and evaluation of newborn for suspected infectious condition ruled out: Secondary | ICD-10-CM | POA: Diagnosis not present

## 2021-11-02 DIAGNOSIS — L89812 Pressure ulcer of head, stage 2: Secondary | ICD-10-CM | POA: Diagnosis not present

## 2021-11-02 DIAGNOSIS — O365931 Maternal care for other known or suspected poor fetal growth, third trimester, fetus 1: Secondary | ICD-10-CM | POA: Diagnosis not present

## 2021-11-02 DIAGNOSIS — Z452 Encounter for adjustment and management of vascular access device: Secondary | ICD-10-CM | POA: Diagnosis not present

## 2021-11-02 DIAGNOSIS — O322XX1 Maternal care for transverse and oblique lie, fetus 1: Secondary | ICD-10-CM | POA: Diagnosis not present

## 2021-11-02 DIAGNOSIS — Q676 Pectus excavatum: Secondary | ICD-10-CM | POA: Diagnosis not present

## 2021-11-02 DIAGNOSIS — Z3A29 29 weeks gestation of pregnancy: Secondary | ICD-10-CM | POA: Diagnosis not present

## 2021-11-02 DIAGNOSIS — R140 Abdominal distension (gaseous): Secondary | ICD-10-CM | POA: Diagnosis not present

## 2021-11-02 DIAGNOSIS — O42913 Preterm premature rupture of membranes, unspecified as to length of time between rupture and onset of labor, third trimester: Secondary | ICD-10-CM | POA: Diagnosis not present

## 2021-11-02 DIAGNOSIS — O26873 Cervical shortening, third trimester: Secondary | ICD-10-CM | POA: Diagnosis not present

## 2021-11-02 DIAGNOSIS — O321XX1 Maternal care for breech presentation, fetus 1: Secondary | ICD-10-CM | POA: Diagnosis not present

## 2021-11-02 DIAGNOSIS — H35123 Retinopathy of prematurity, stage 1, bilateral: Secondary | ICD-10-CM | POA: Diagnosis not present

## 2021-11-02 DIAGNOSIS — O3433 Maternal care for cervical incompetence, third trimester: Secondary | ICD-10-CM | POA: Diagnosis not present

## 2021-11-02 DIAGNOSIS — Z9189 Other specified personal risk factors, not elsewhere classified: Secondary | ICD-10-CM | POA: Diagnosis not present

## 2021-11-02 DIAGNOSIS — O30043 Twin pregnancy, dichorionic/diamniotic, third trimester: Secondary | ICD-10-CM | POA: Diagnosis not present

## 2021-11-02 DIAGNOSIS — Z30017 Encounter for initial prescription of implantable subdermal contraceptive: Secondary | ICD-10-CM | POA: Diagnosis not present

## 2021-11-02 DIAGNOSIS — E440 Moderate protein-calorie malnutrition: Secondary | ICD-10-CM | POA: Diagnosis not present

## 2021-11-02 DIAGNOSIS — Z3A34 34 weeks gestation of pregnancy: Secondary | ICD-10-CM | POA: Diagnosis not present

## 2021-11-02 DIAGNOSIS — O321XX2 Maternal care for breech presentation, fetus 2: Secondary | ICD-10-CM | POA: Diagnosis not present

## 2021-11-02 DIAGNOSIS — E441 Mild protein-calorie malnutrition: Secondary | ICD-10-CM | POA: Diagnosis not present

## 2021-11-02 DIAGNOSIS — O99334 Smoking (tobacco) complicating childbirth: Secondary | ICD-10-CM | POA: Diagnosis not present

## 2021-11-02 DIAGNOSIS — H35113 Retinopathy of prematurity, stage 0, bilateral: Secondary | ICD-10-CM | POA: Diagnosis not present

## 2021-11-02 DIAGNOSIS — F17210 Nicotine dependence, cigarettes, uncomplicated: Secondary | ICD-10-CM | POA: Diagnosis not present

## 2021-11-02 DIAGNOSIS — O322XX2 Maternal care for transverse and oblique lie, fetus 2: Secondary | ICD-10-CM | POA: Diagnosis not present

## 2021-11-02 DIAGNOSIS — O365932 Maternal care for other known or suspected poor fetal growth, third trimester, fetus 2: Secondary | ICD-10-CM | POA: Diagnosis not present

## 2021-11-22 NOTE — Telephone Encounter (Signed)
Nexplanon rcvd/charged 01/20/2019

## 2021-12-22 DIAGNOSIS — F17210 Nicotine dependence, cigarettes, uncomplicated: Secondary | ICD-10-CM | POA: Diagnosis not present

## 2021-12-22 DIAGNOSIS — O99335 Smoking (tobacco) complicating the puerperium: Secondary | ICD-10-CM | POA: Diagnosis not present

## 2021-12-22 DIAGNOSIS — Z30017 Encounter for initial prescription of implantable subdermal contraceptive: Secondary | ICD-10-CM | POA: Diagnosis not present

## 2022-02-24 DIAGNOSIS — R599 Enlarged lymph nodes, unspecified: Secondary | ICD-10-CM | POA: Diagnosis not present

## 2022-02-24 DIAGNOSIS — R221 Localized swelling, mass and lump, neck: Secondary | ICD-10-CM | POA: Diagnosis not present

## 2022-03-01 DIAGNOSIS — L723 Sebaceous cyst: Secondary | ICD-10-CM | POA: Diagnosis not present

## 2022-04-04 DIAGNOSIS — F331 Major depressive disorder, recurrent, moderate: Secondary | ICD-10-CM | POA: Diagnosis not present

## 2022-04-04 DIAGNOSIS — F411 Generalized anxiety disorder: Secondary | ICD-10-CM | POA: Diagnosis not present

## 2022-04-11 DIAGNOSIS — F331 Major depressive disorder, recurrent, moderate: Secondary | ICD-10-CM | POA: Diagnosis not present

## 2022-04-11 DIAGNOSIS — F411 Generalized anxiety disorder: Secondary | ICD-10-CM | POA: Diagnosis not present

## 2022-04-26 DIAGNOSIS — Z0184 Encounter for antibody response examination: Secondary | ICD-10-CM | POA: Diagnosis not present

## 2022-04-27 DIAGNOSIS — F4011 Social phobia, generalized: Secondary | ICD-10-CM | POA: Diagnosis not present

## 2022-04-27 DIAGNOSIS — F331 Major depressive disorder, recurrent, moderate: Secondary | ICD-10-CM | POA: Diagnosis not present

## 2022-04-27 DIAGNOSIS — F411 Generalized anxiety disorder: Secondary | ICD-10-CM | POA: Diagnosis not present

## 2022-05-01 DIAGNOSIS — F411 Generalized anxiety disorder: Secondary | ICD-10-CM | POA: Diagnosis not present

## 2022-05-01 DIAGNOSIS — F331 Major depressive disorder, recurrent, moderate: Secondary | ICD-10-CM | POA: Diagnosis not present

## 2022-05-17 DIAGNOSIS — F331 Major depressive disorder, recurrent, moderate: Secondary | ICD-10-CM | POA: Diagnosis not present

## 2022-05-17 DIAGNOSIS — F411 Generalized anxiety disorder: Secondary | ICD-10-CM | POA: Diagnosis not present

## 2022-05-24 DIAGNOSIS — F4011 Social phobia, generalized: Secondary | ICD-10-CM | POA: Diagnosis not present

## 2022-05-24 DIAGNOSIS — F411 Generalized anxiety disorder: Secondary | ICD-10-CM | POA: Diagnosis not present

## 2022-05-24 DIAGNOSIS — F331 Major depressive disorder, recurrent, moderate: Secondary | ICD-10-CM | POA: Diagnosis not present

## 2022-05-24 DIAGNOSIS — F900 Attention-deficit hyperactivity disorder, predominantly inattentive type: Secondary | ICD-10-CM | POA: Diagnosis not present

## 2022-06-19 DIAGNOSIS — F900 Attention-deficit hyperactivity disorder, predominantly inattentive type: Secondary | ICD-10-CM | POA: Diagnosis not present

## 2022-06-19 DIAGNOSIS — F4011 Social phobia, generalized: Secondary | ICD-10-CM | POA: Diagnosis not present

## 2022-06-19 DIAGNOSIS — F331 Major depressive disorder, recurrent, moderate: Secondary | ICD-10-CM | POA: Diagnosis not present

## 2022-06-19 DIAGNOSIS — F411 Generalized anxiety disorder: Secondary | ICD-10-CM | POA: Diagnosis not present

## 2022-07-03 DIAGNOSIS — F411 Generalized anxiety disorder: Secondary | ICD-10-CM | POA: Diagnosis not present

## 2022-07-03 DIAGNOSIS — F331 Major depressive disorder, recurrent, moderate: Secondary | ICD-10-CM | POA: Diagnosis not present

## 2022-07-05 DIAGNOSIS — F902 Attention-deficit hyperactivity disorder, combined type: Secondary | ICD-10-CM | POA: Diagnosis not present

## 2022-07-17 DIAGNOSIS — F902 Attention-deficit hyperactivity disorder, combined type: Secondary | ICD-10-CM | POA: Diagnosis not present

## 2022-07-17 DIAGNOSIS — F331 Major depressive disorder, recurrent, moderate: Secondary | ICD-10-CM | POA: Diagnosis not present

## 2022-07-17 DIAGNOSIS — F411 Generalized anxiety disorder: Secondary | ICD-10-CM | POA: Diagnosis not present

## 2022-07-19 DIAGNOSIS — F902 Attention-deficit hyperactivity disorder, combined type: Secondary | ICD-10-CM | POA: Diagnosis not present

## 2022-08-01 DIAGNOSIS — F331 Major depressive disorder, recurrent, moderate: Secondary | ICD-10-CM | POA: Diagnosis not present

## 2022-08-01 DIAGNOSIS — F411 Generalized anxiety disorder: Secondary | ICD-10-CM | POA: Diagnosis not present

## 2022-08-09 DIAGNOSIS — F4011 Social phobia, generalized: Secondary | ICD-10-CM | POA: Diagnosis not present

## 2022-08-09 DIAGNOSIS — F411 Generalized anxiety disorder: Secondary | ICD-10-CM | POA: Diagnosis not present

## 2022-08-09 DIAGNOSIS — F900 Attention-deficit hyperactivity disorder, predominantly inattentive type: Secondary | ICD-10-CM | POA: Diagnosis not present

## 2022-08-09 DIAGNOSIS — F331 Major depressive disorder, recurrent, moderate: Secondary | ICD-10-CM | POA: Diagnosis not present

## 2022-08-15 DIAGNOSIS — F331 Major depressive disorder, recurrent, moderate: Secondary | ICD-10-CM | POA: Diagnosis not present

## 2022-08-15 DIAGNOSIS — F411 Generalized anxiety disorder: Secondary | ICD-10-CM | POA: Diagnosis not present

## 2022-09-04 DIAGNOSIS — F411 Generalized anxiety disorder: Secondary | ICD-10-CM | POA: Diagnosis not present

## 2022-09-04 DIAGNOSIS — F331 Major depressive disorder, recurrent, moderate: Secondary | ICD-10-CM | POA: Diagnosis not present

## 2022-09-05 DIAGNOSIS — F4011 Social phobia, generalized: Secondary | ICD-10-CM | POA: Diagnosis not present

## 2022-09-05 DIAGNOSIS — F331 Major depressive disorder, recurrent, moderate: Secondary | ICD-10-CM | POA: Diagnosis not present

## 2022-09-05 DIAGNOSIS — F900 Attention-deficit hyperactivity disorder, predominantly inattentive type: Secondary | ICD-10-CM | POA: Diagnosis not present

## 2022-09-05 DIAGNOSIS — F411 Generalized anxiety disorder: Secondary | ICD-10-CM | POA: Diagnosis not present

## 2022-09-27 DIAGNOSIS — F331 Major depressive disorder, recurrent, moderate: Secondary | ICD-10-CM | POA: Diagnosis not present

## 2022-09-27 DIAGNOSIS — F411 Generalized anxiety disorder: Secondary | ICD-10-CM | POA: Diagnosis not present

## 2022-10-04 DIAGNOSIS — F411 Generalized anxiety disorder: Secondary | ICD-10-CM | POA: Diagnosis not present

## 2022-10-04 DIAGNOSIS — F4011 Social phobia, generalized: Secondary | ICD-10-CM | POA: Diagnosis not present

## 2022-10-04 DIAGNOSIS — F331 Major depressive disorder, recurrent, moderate: Secondary | ICD-10-CM | POA: Diagnosis not present

## 2022-10-04 DIAGNOSIS — F900 Attention-deficit hyperactivity disorder, predominantly inattentive type: Secondary | ICD-10-CM | POA: Diagnosis not present

## 2022-10-11 DIAGNOSIS — F900 Attention-deficit hyperactivity disorder, predominantly inattentive type: Secondary | ICD-10-CM | POA: Diagnosis not present

## 2022-10-11 DIAGNOSIS — F331 Major depressive disorder, recurrent, moderate: Secondary | ICD-10-CM | POA: Diagnosis not present

## 2022-10-11 DIAGNOSIS — F411 Generalized anxiety disorder: Secondary | ICD-10-CM | POA: Diagnosis not present

## 2022-10-19 IMAGING — US US BREAST*L* LIMITED INC AXILLA
1 series · 1 of 1 positions shown · non-contrast
Comparison: None.

CLINICAL DATA: 39-year-old female presenting with a new lump in the
upper left breast/chest. Family history of breast cancer in the
patient's mother and paternal grandmother.

EXAM:
DIGITAL DIAGNOSTIC BILATERAL MAMMOGRAM WITH CAD AND TOMO
ULTRASOUND BILATERAL BREAST

[Series 1: us breast*left* limited inc axilla · 0.06mm/px · 1 of 1 slices shown]
[im 1/1]
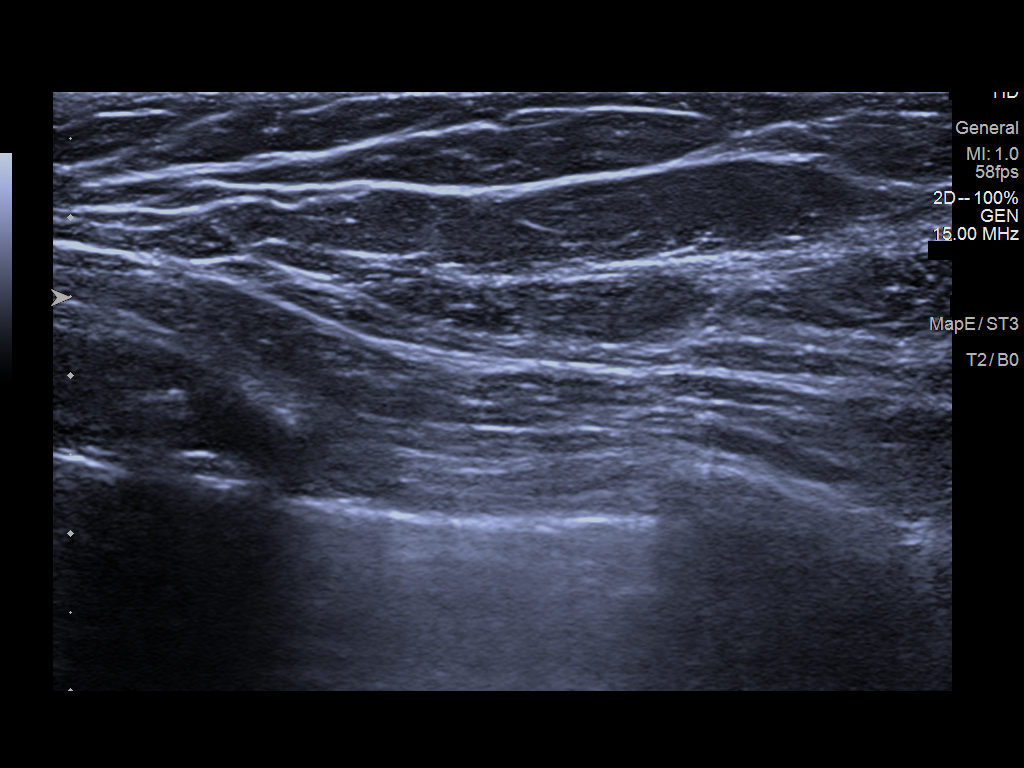

[1 of 1 positions shown; findings below may reference images not displayed]

ACR Breast Density Category c: The breast tissue is heterogeneously
dense, which may obscure small masses.
FINDINGS: Mammogram:

Right breast: No suspicious mass, distortion, or microcalcifications
are identified to suggest presence of malignancy.

There are a few prominent right axillary lymph nodes.

Left breast: A skin BB marks the palpable site of concern reported
by the patient in the upper inner left breast. A spot tangential
view of this areas were performed in addition to standard views.
There is no abnormality at the palpable site or elsewhere in the
left breast.

Mammographic images were processed with CAD.

On physical exam, I feel a ridge at the site of concern reported by
the patient but no fixed discrete mass.

Ultrasound:

Targeted ultrasound is performed at the palpable site of concern
reported by the patient in the upper inner left breast demonstrating
no suspicious cystic or solid mass.

Targeted ultrasound is performed in the right axilla demonstrating
normal lymph nodes with retained fatty hila and cortical thickness
measuring up to 0.3 cm.
IMPRESSION: 1. At the palpable site of concern in the upper inner left
breast/chest wall there is no mammographic or sonographic evidence
of malignancy.

2.  Normal right axillary lymph nodes.

3.  No mammographic evidence of malignancy in the bilateral breasts.

RECOMMENDATION:
1. Recommend any further workup of the palpable site in the left
breast be on a clinical basis.

2. Breast cancer risk assessment. Consider screening mammogram in 1
year given strong family history. If patient is at elevated lifetime
risk of breast cancer, screening breast MRI would be recommended.

I have discussed the findings and recommendations with the patient.
If applicable, a reminder letter will be sent to the patient
regarding the next appointment.

BI-RADS CATEGORY  1: Negative.

## 2022-10-19 IMAGING — US US BREAST*R* LIMITED INC AXILLA
1 series · 9 of 9 positions shown · non-contrast
Comparison: None.

CLINICAL DATA: 39-year-old female presenting with a new lump in the
upper left breast/chest. Family history of breast cancer in the
patient's mother and paternal grandmother.

EXAM:
DIGITAL DIAGNOSTIC BILATERAL MAMMOGRAM WITH CAD AND TOMO
ULTRASOUND BILATERAL BREAST

[Series 1: us breast*right* limited inc axilla · 0.06mm/px · 9 of 9 slices shown]
[im 1/9]
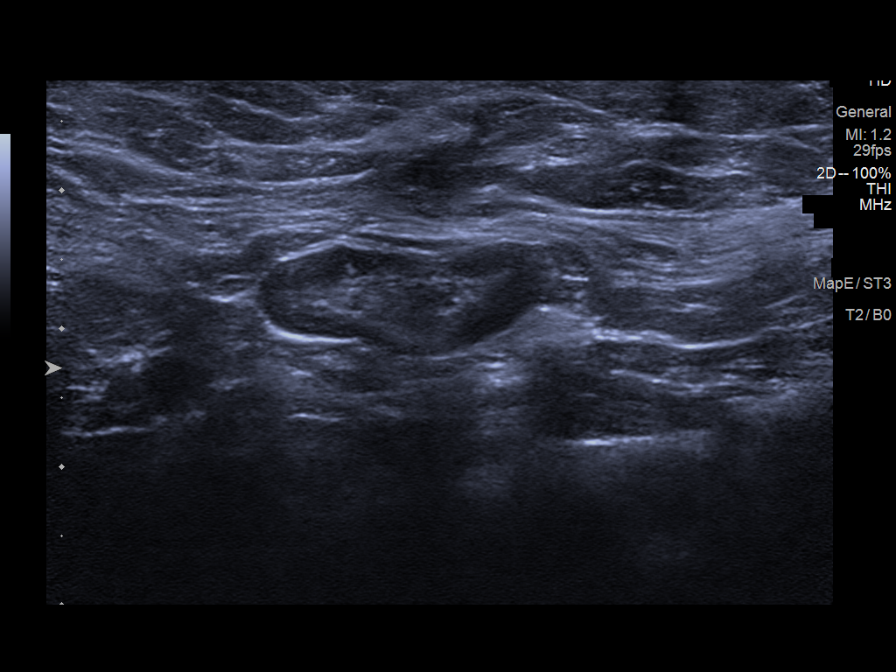
[im 2/9]
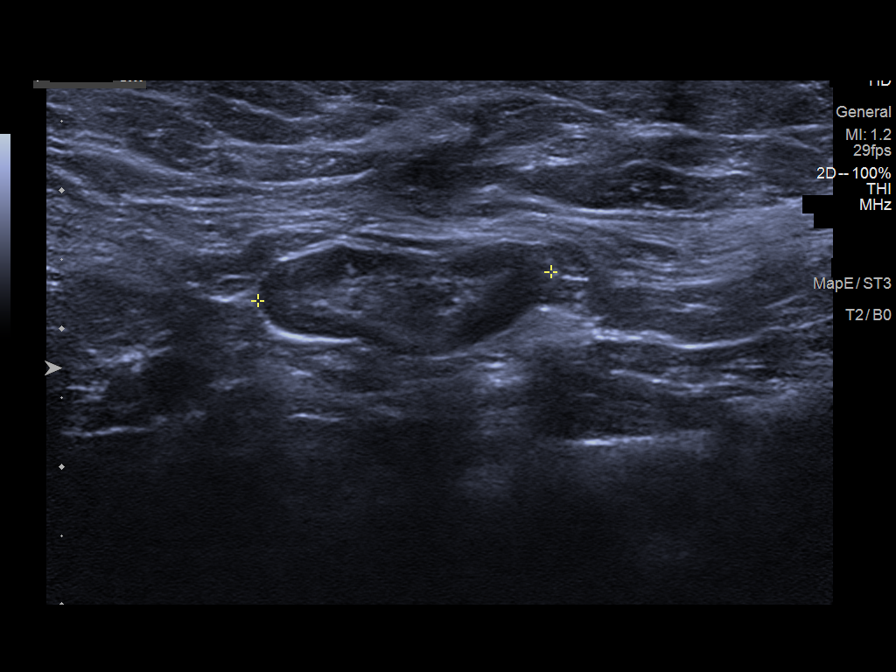
[im 3/9]
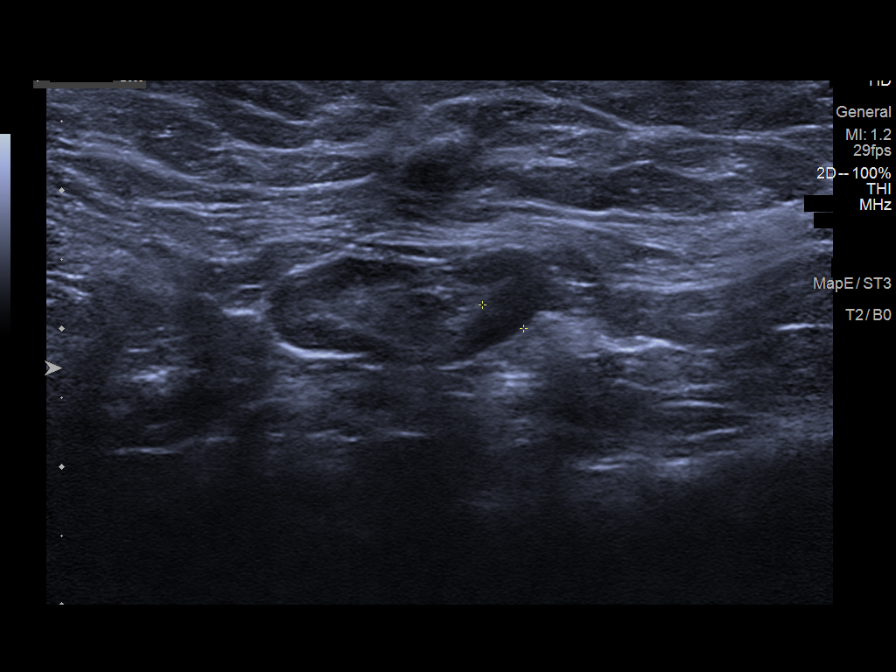
[im 4/9]
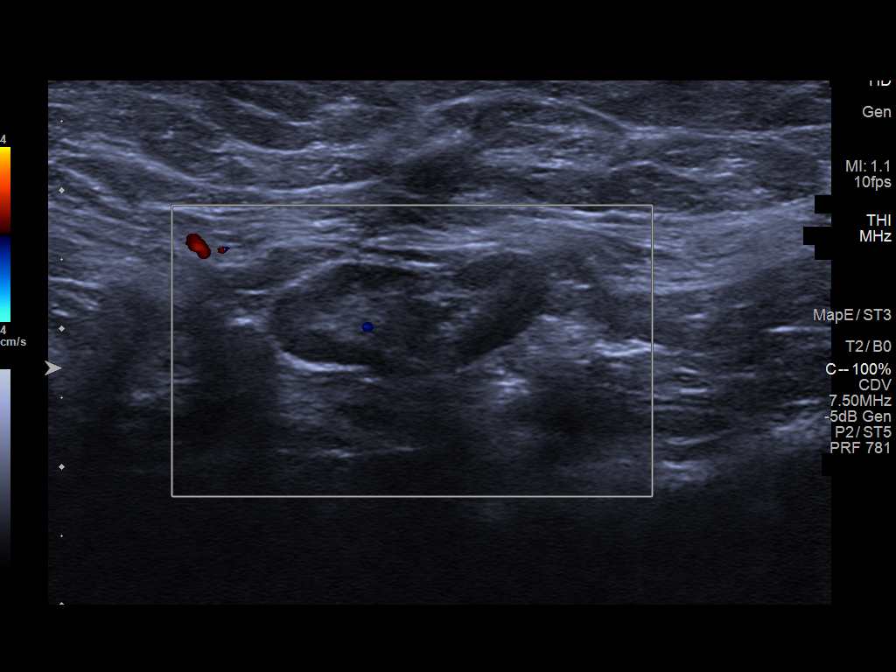
[im 5/9]
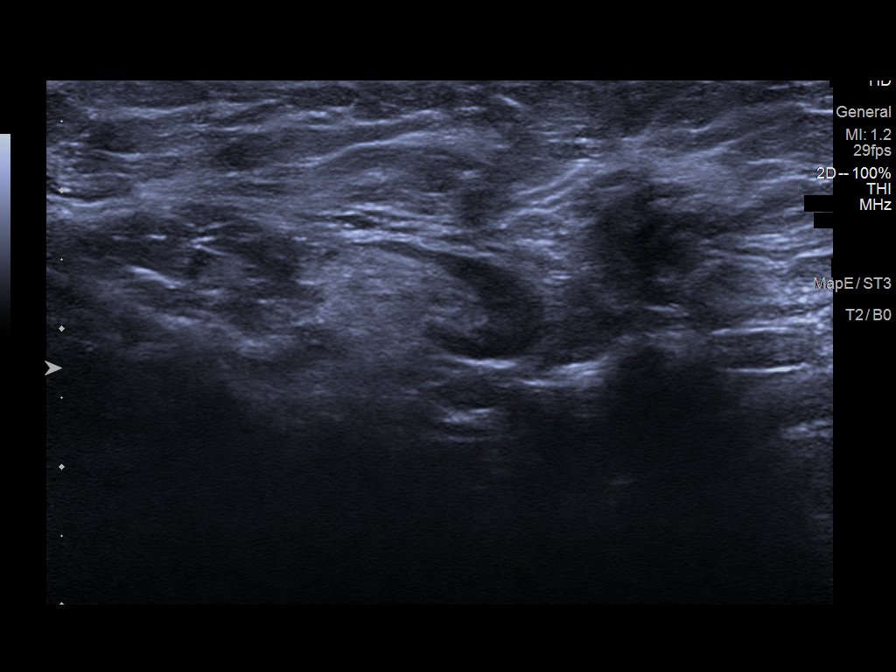
[im 6/9]
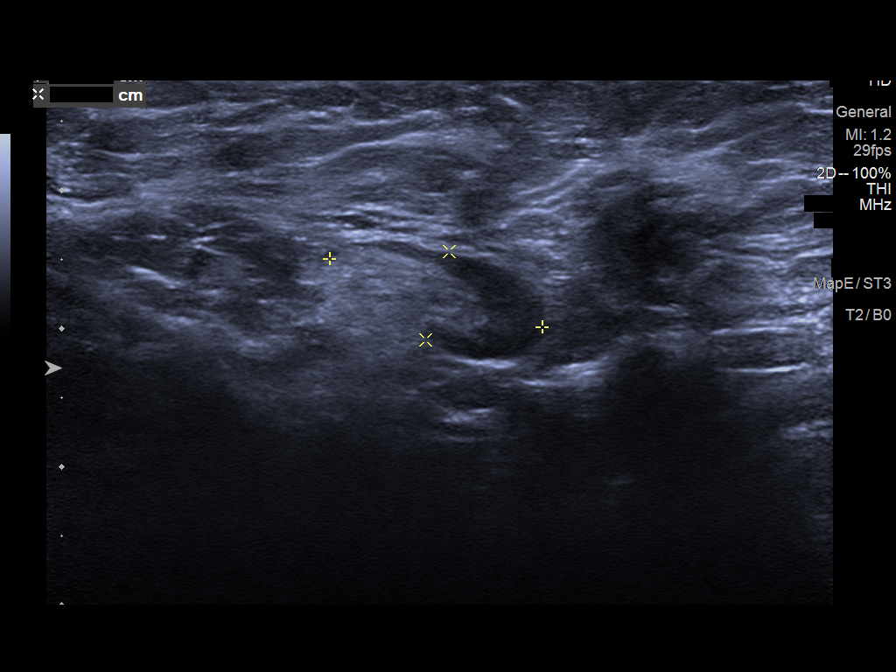
[im 7/9]
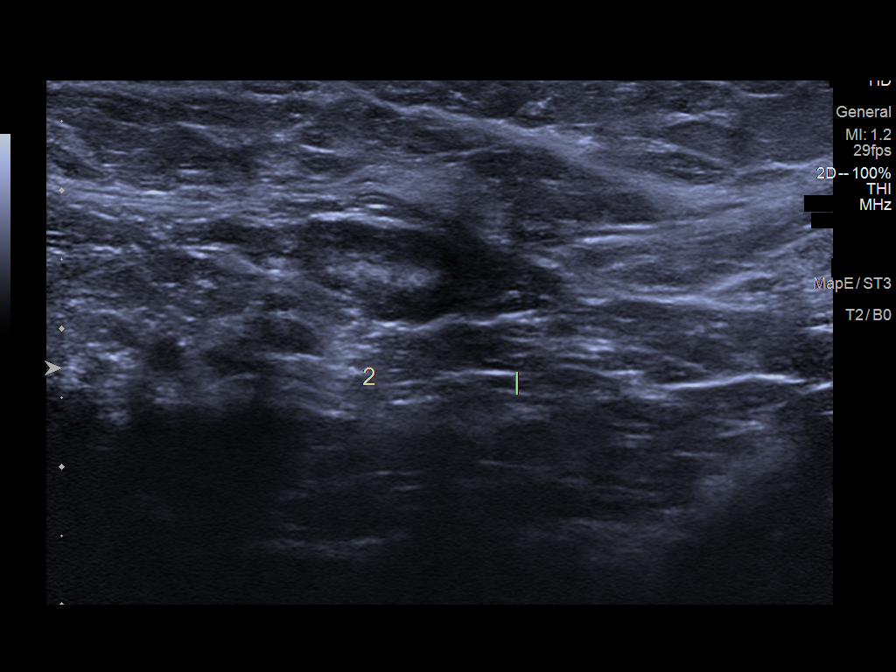
[im 8/9]
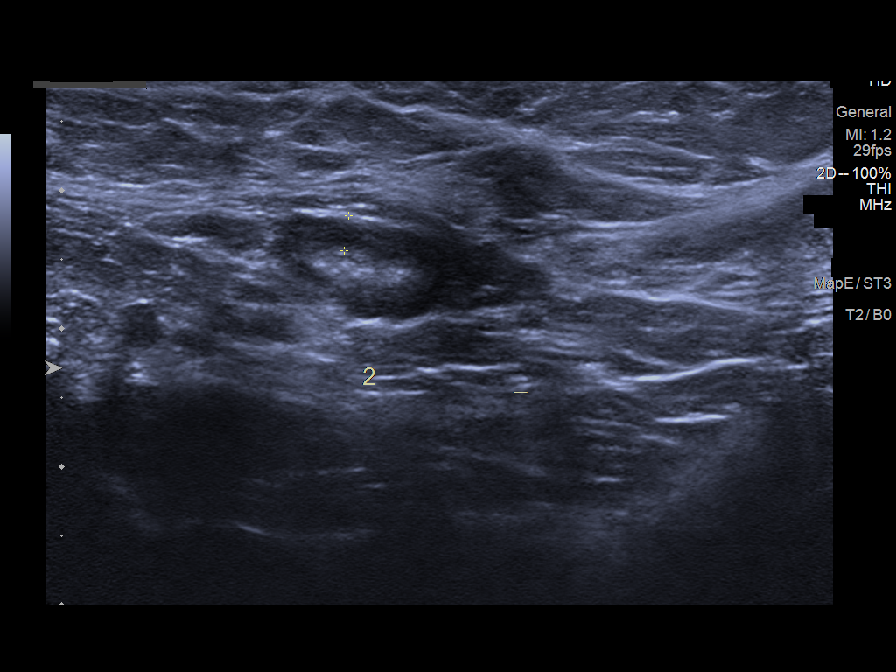
[im 9/9]
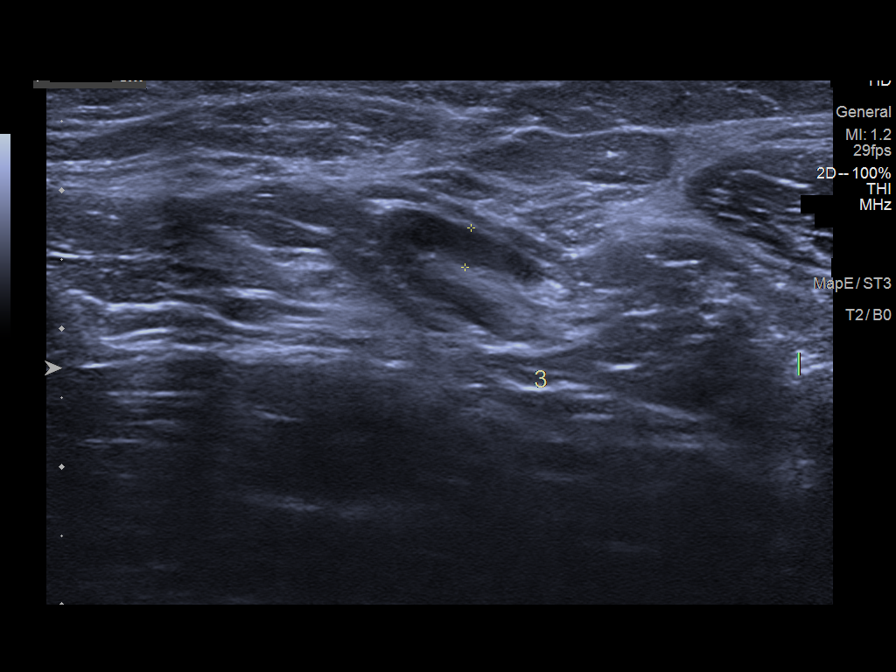

[9 of 9 positions shown; findings below may reference images not displayed]

ACR Breast Density Category c: The breast tissue is heterogeneously
dense, which may obscure small masses.
FINDINGS: Mammogram:

Right breast: No suspicious mass, distortion, or microcalcifications
are identified to suggest presence of malignancy.

There are a few prominent right axillary lymph nodes.

Left breast: A skin BB marks the palpable site of concern reported
by the patient in the upper inner left breast. A spot tangential
view of this areas were performed in addition to standard views.
There is no abnormality at the palpable site or elsewhere in the
left breast.

Mammographic images were processed with CAD.

On physical exam, I feel a ridge at the site of concern reported by
the patient but no fixed discrete mass.

Ultrasound:

Targeted ultrasound is performed at the palpable site of concern
reported by the patient in the upper inner left breast demonstrating
no suspicious cystic or solid mass.

Targeted ultrasound is performed in the right axilla demonstrating
normal lymph nodes with retained fatty hila and cortical thickness
measuring up to 0.3 cm.
IMPRESSION: 1. At the palpable site of concern in the upper inner left
breast/chest wall there is no mammographic or sonographic evidence
of malignancy.

2.  Normal right axillary lymph nodes.

3.  No mammographic evidence of malignancy in the bilateral breasts.

RECOMMENDATION:
1. Recommend any further workup of the palpable site in the left
breast be on a clinical basis.

2. Breast cancer risk assessment. Consider screening mammogram in 1
year given strong family history. If patient is at elevated lifetime
risk of breast cancer, screening breast MRI would be recommended.

I have discussed the findings and recommendations with the patient.
If applicable, a reminder letter will be sent to the patient
regarding the next appointment.

BI-RADS CATEGORY  1: Negative.

## 2022-11-28 DIAGNOSIS — F411 Generalized anxiety disorder: Secondary | ICD-10-CM | POA: Diagnosis not present

## 2022-11-28 DIAGNOSIS — F900 Attention-deficit hyperactivity disorder, predominantly inattentive type: Secondary | ICD-10-CM | POA: Diagnosis not present

## 2022-11-28 DIAGNOSIS — F331 Major depressive disorder, recurrent, moderate: Secondary | ICD-10-CM | POA: Diagnosis not present

## 2022-11-28 DIAGNOSIS — F4011 Social phobia, generalized: Secondary | ICD-10-CM | POA: Diagnosis not present

## 2023-01-09 ENCOUNTER — Encounter: Payer: Self-pay | Admitting: Obstetrics and Gynecology

## 2023-01-09 ENCOUNTER — Other Ambulatory Visit: Payer: Self-pay | Admitting: Obstetrics and Gynecology

## 2023-01-09 DIAGNOSIS — Z1231 Encounter for screening mammogram for malignant neoplasm of breast: Secondary | ICD-10-CM

## 2023-01-09 DIAGNOSIS — R928 Other abnormal and inconclusive findings on diagnostic imaging of breast: Secondary | ICD-10-CM

## 2023-01-29 ENCOUNTER — Ambulatory Visit
Admission: RE | Admit: 2023-01-29 | Discharge: 2023-01-29 | Disposition: A | Discharge status: Home / Self Care | Payer: BC Managed Care – PPO | Source: Ambulatory Visit | Attending: Obstetrics and Gynecology | Admitting: Obstetrics and Gynecology

## 2023-01-29 ENCOUNTER — Encounter: Payer: Self-pay | Admitting: Radiology

## 2023-01-29 DIAGNOSIS — Z1231 Encounter for screening mammogram for malignant neoplasm of breast: Secondary | ICD-10-CM | POA: Insufficient documentation

## 2024-02-12 ENCOUNTER — Other Ambulatory Visit: Payer: Self-pay | Admitting: Internal Medicine

## 2024-02-12 DIAGNOSIS — Z1231 Encounter for screening mammogram for malignant neoplasm of breast: Secondary | ICD-10-CM

## 2024-03-26 ENCOUNTER — Encounter

## 2024-07-17 NOTE — Progress Notes (Signed)
 History of Present Illness Ashley Soto is a 43 year old female who presents with joint pain primarily at night.  She has been experiencing joint pain primarily at night for a little over a week, mainly in her hands and right shoulder. The pain in her hands is described as excruciating, particularly when bending her fingers, making it difficult to make a fist. This pain disrupts her sleep, although it improves by mid-morning, leaving her knuckles slightly sore during the day. No swelling, numbness, or tingling in her hands. No similar pain in her knees, hips, or feet.  The right shoulder pain has been present for about two weeks, which she attributes to possibly tweaking it. This pain has made it difficult for her to lift her daughter into her car seat. She does not believe the shoulder pain is related to the hand pain.  She has tried over-the-counter medications such as Tylenol  and Aleve, but finds it hard to determine their effectiveness. No recent illness, fever, chills, or viral infections in the past two to three weeks. [place] has periodic tick exposures but does not believe any ticks have been attached for long periods.  Her medical history includes ADHD, lymphocytosis, primary insomnia, bilateral TMJ disorder, and a remote history of benzodiazepine substance abuse. She mentions a family history of rheumatoid arthritis, as her paternal grandmother had debilitating rheumatoid arthritis.  Current Outpatient Medications  Medication Sig Dispense Refill  . busPIRone  (BUSPAR ) 10 MG tablet     . dextroamphetamine-amphetamine (ADDERALL) 15 mg tablet     . FLUoxetine  (PROZAC ) 40 MG capsule     . hydrOXYzine (ATARAX) 25 MG tablet Take 25 mg by mouth once daily as needed for Itching    . traZODone  (DESYREL ) 50 MG tablet Take 50 mg by mouth at bedtime     No current facility-administered medications for this visit.    Allergies as of 07/18/2024 - Reviewed 07/18/2024   Allergen Reaction Noted  . Paxil [paroxetine hcl] Other (See Comments)   . Sulfa (sulfonamide antibiotics) Hives     Past Medical History:  Diagnosis Date  . Anxiety   . Bilateral temporomandibular joint disorder   . Breast lump on left side at 10 o'clock position 11/2020   At the palpable site of concern in the upper inner left  breast/chest wall there is no mammographic or sonographic evidence  of malignancy  . Cystic acne 11/25/2014  . Depression   . Dichorionic diamniotic twin pregnancy (HHS-HCC) 2022   Didi twins, PPROM in the setting of short cervix with cerclage  S/p pLTCS at [redacted]w[redacted]d for breech transverse presentations. No complications  . Family history of breast cancer    mom, PGM  . Family history of suicide attempt    MGM  . History of substance abuse (CMS/HHS-HCC)    status post detoxification for Xanax  abuse, May 2008, under the direction of Dr. Bernardino  . IUGR (intrauterine growth restriction) affecting care of mother, third trimester, fetus 2 (HHS-HCC) 09/2021   11/3 normal dopplers with twin A now with oligohydramnios 10/20 New dx FGR twin B at [redacted]w[redacted]d Twin A 32%ile Twin B 562g EFW 6%ile, AC 3%ile, HC <1%ile, nl UAD, nl MVP but subjectively low Low-risk cfDNA per pt  . Oligohydramnios in second trimester (HHS-HCC) 10/2021   10/20/2021 Oligohydramnios of twin A today Weekly UAD from suspected FGR of twin B Serial growth q 3 weeks  . Polycystic disease, ovaries   . Primary insomnia   .  Recurrent pregnancy loss   . Seizures (CMS/HHS-HCC)    Seizure disorder developing in the context of benzodiazepine withdrawal. Previously on Keppra, has been off medication times years with no recurrence.  . Short cervix    history of preterm delivery with cervical shortening  . Tinea corporis   . Tobacco use during pregnancy (HHS-HCC) 08/2021    Past Surgical History:  Procedure Laterality Date  . DILATION AND CURETTAGE, DIAGNOSTIC / THERAPEUTIC  03/19/2016   Dr. Bebe Furry; due to  miscarriage  . De Quervain's release  Left 12/28/2020   Dr. Kathlynn  . CERCLAGE CERVIX  08/31/2021   Dr. Josette Rikki Medley  . CESAREAN SECTION  11/11/2021   Dr. Lauraine Stacia Lesches  . DILATION AND CURETTAGE OF UTERUS    . TONSILLECTOMY        BP 112/80   Pulse 90   Ht 167.6 cm (5' 6)   Wt 59.1 kg (130 lb 6.4 oz)   LMP  (LMP Unknown)   SpO2 98%   BMI 21.05 kg/m   General: WD/WN patient, in no acute distress Neck: trachea midline; no thyromegaly Lungs:no wheeze or retraction Extremities:  No clubbing, cyanosis or edema.  No synovitis.  No specific point tenderness in the hands, elbows, knees.  Discomfort with abduction of the right shoulder to 85 degrees.  Limitation with placing the hand to the back of the head.  Discomfort in the [facility] joint with active resistance to forward rotation Neuro: Motor and sensory exams appear intact and symmetrical; gait intact   Polyarthralgia  (primary encounter diagnosis)  Assessment & Plan Bilateral hand pain, suspected inflammatory arthritis Bilateral hand pain with difficulty moving fingers, likely inflammatory arthritis. Family history of rheumatoid arthritis. Carpal tunnel syndrome unlikely. - Order blood tests for inflammatory arthritis, rheumatoid arthritis, lupus, and gout. - Prescribe diclofenac twice daily with food for seven days. - Advise against ibuprofen  or Aleve; allow Tylenol  and topical treatments. - Instruct to report back in a week with symptom update. - Consider referral to arthritis specialist if no improvement or labs indicate inflammatory arthritis.  Right shoulder pain with rotator cuff and trapezius inflammation Right shoulder pain likely due to rotator cuff and trapezius inflammation, suggestive of wear and tear. - Prescribe diclofenac for shoulder pain. - Provide rotator cuff stretching exercises. - Recommend heat application and massage. - Advise to report back in a week with symptom update. - Consider  referral to orthopedics if pain persists.   BERT JACK KLEIN III, MD   Portions of this note were created using dictation software and may contain typographical errors.   This note has been created using automated tools and reviewed for accuracy by BERT JACK KLEIN III.   Use of artificial intelligence tool (ABRIDGE) to help with documentation was discussed with patient.

## 2024-07-21 ENCOUNTER — Encounter (HOSPITAL_COMMUNITY): Payer: Self-pay | Admitting: Emergency Medicine

## 2024-07-21 ENCOUNTER — Emergency Department (HOSPITAL_COMMUNITY)

## 2024-07-21 ENCOUNTER — Other Ambulatory Visit: Payer: Self-pay

## 2024-07-21 ENCOUNTER — Emergency Department (HOSPITAL_COMMUNITY)
Admission: EM | Admit: 2024-07-21 | Discharge: 2024-07-21 | Disposition: A | Discharge status: Home / Self Care | Attending: Emergency Medicine | Admitting: Emergency Medicine

## 2024-07-21 DIAGNOSIS — Z7982 Long term (current) use of aspirin: Secondary | ICD-10-CM | POA: Insufficient documentation

## 2024-07-21 DIAGNOSIS — K529 Noninfective gastroenteritis and colitis, unspecified: Secondary | ICD-10-CM | POA: Insufficient documentation

## 2024-07-21 DIAGNOSIS — R1084 Generalized abdominal pain: Secondary | ICD-10-CM | POA: Diagnosis not present

## 2024-07-21 DIAGNOSIS — K812 Acute cholecystitis with chronic cholecystitis: Secondary | ICD-10-CM | POA: Diagnosis not present

## 2024-07-21 DIAGNOSIS — R102 Pelvic and perineal pain: Secondary | ICD-10-CM | POA: Insufficient documentation

## 2024-07-21 LAB — CBC WITH DIFFERENTIAL/PLATELET
Abs Immature Granulocytes: 0.13 K/uL — ABNORMAL HIGH (ref 0.00–0.07)
Basophils Absolute: 0.1 K/uL (ref 0.0–0.1)
Basophils Relative: 0 %
Eosinophils Absolute: 0 K/uL (ref 0.0–0.5)
Eosinophils Relative: 0 %
HCT: 41.9 % (ref 36.0–46.0)
Hemoglobin: 14.9 g/dL (ref 12.0–15.0)
Immature Granulocytes: 1 %
Lymphocytes Relative: 12 %
Lymphs Abs: 2.2 K/uL (ref 0.7–4.0)
MCH: 31.3 pg (ref 26.0–34.0)
MCHC: 35.6 g/dL (ref 30.0–36.0)
MCV: 88 fL (ref 80.0–100.0)
Monocytes Absolute: 0.6 K/uL (ref 0.1–1.0)
Monocytes Relative: 4 %
Neutro Abs: 14.4 K/uL — ABNORMAL HIGH (ref 1.7–7.7)
Neutrophils Relative %: 83 %
Platelets: 339 K/uL (ref 150–400)
RBC: 4.76 MIL/uL (ref 3.87–5.11)
RDW: 12.4 % (ref 11.5–15.5)
WBC: 17.3 K/uL — ABNORMAL HIGH (ref 4.0–10.5)
nRBC: 0 % (ref 0.0–0.2)

## 2024-07-21 LAB — COMPREHENSIVE METABOLIC PANEL WITH GFR
ALT: 19 U/L (ref 0–44)
AST: 26 U/L (ref 15–41)
Albumin: 4.1 g/dL (ref 3.5–5.0)
Alkaline Phosphatase: 58 U/L (ref 38–126)
Anion gap: 12 (ref 5–15)
BUN: 10 mg/dL (ref 6–20)
CO2: 21 mmol/L — ABNORMAL LOW (ref 22–32)
Calcium: 9.4 mg/dL (ref 8.9–10.3)
Chloride: 101 mmol/L (ref 98–111)
Creatinine, Ser: 0.56 mg/dL (ref 0.44–1.00)
GFR, Estimated: >60 mL/min (ref >60)
Glucose, Bld: 134 mg/dL — ABNORMAL HIGH (ref 70–99)
Potassium: 3.8 mmol/L (ref 3.5–5.1)
Sodium: 134 mmol/L — ABNORMAL LOW (ref 135–145)
Total Bilirubin: 0.9 mg/dL (ref 0.0–1.2)
Total Protein: 7.6 g/dL (ref 6.5–8.1)

## 2024-07-21 LAB — URINALYSIS, ROUTINE W REFLEX MICROSCOPIC
Bacteria, UA: NONE SEEN
Bilirubin Urine: NEGATIVE
Glucose, UA: NEGATIVE mg/dL
Hgb urine dipstick: NEGATIVE
Ketones, ur: 20 mg/dL — AB
Leukocytes,Ua: NEGATIVE
Nitrite: NEGATIVE
Protein, ur: 30 mg/dL — AB
Specific Gravity, Urine: 1.024 (ref 1.005–1.030)
pH: 8 (ref 5.0–8.0)

## 2024-07-21 LAB — HCG, QUANTITATIVE, PREGNANCY: hCG, Beta Chain, Quant, S: <1 m[IU]/mL (ref <5)

## 2024-07-21 LAB — LIPASE, BLOOD: Lipase: 33 U/L (ref 11–51)

## 2024-07-21 MED ORDER — FENTANYL CITRATE (PF) 100 MCG/2ML IJ SOLN
50.0000 ug | Freq: Once | INTRAMUSCULAR | Status: AC
  Administered 2024-07-21: 50 ug via INTRAVENOUS
  Filled 2024-07-21: qty 2

## 2024-07-21 MED ORDER — ONDANSETRON HCL 4 MG/2ML IJ SOLN
4.0000 mg | Freq: Once | INTRAMUSCULAR | Status: AC
  Administered 2024-07-21: 4 mg via INTRAVENOUS
  Filled 2024-07-21: qty 2

## 2024-07-21 MED ORDER — AMOXICILLIN-POT CLAVULANATE 875-125 MG PO TABS
1.0000 | ORAL_TABLET | Freq: Once | ORAL | Status: AC
  Administered 2024-07-21: 1 via ORAL
  Filled 2024-07-21: qty 1

## 2024-07-21 MED ORDER — ONDANSETRON 4 MG PO TBDP: 4.0000 mg | ORAL_TABLET | Freq: Three times a day (TID) | ORAL | 0 refills | Status: AC | PRN

## 2024-07-21 MED ORDER — HYDROMORPHONE HCL 1 MG/ML IJ SOLN
0.5000 mg | Freq: Once | INTRAMUSCULAR | Status: AC
  Administered 2024-07-21: 0.5 mg via INTRAVENOUS
  Filled 2024-07-21: qty 0.5

## 2024-07-21 MED ORDER — KETOROLAC TROMETHAMINE 15 MG/ML IJ SOLN
15.0000 mg | Freq: Once | INTRAMUSCULAR | Status: AC
  Administered 2024-07-21: 15 mg via INTRAVENOUS
  Filled 2024-07-21: qty 1

## 2024-07-21 MED ORDER — HYDROMORPHONE HCL 1 MG/ML IJ SOLN
1.0000 mg | Freq: Once | INTRAMUSCULAR | Status: AC
  Administered 2024-07-21: 1 mg via INTRAVENOUS
  Filled 2024-07-21: qty 1

## 2024-07-21 MED ORDER — AMOXICILLIN-POT CLAVULANATE 875-125 MG PO TABS: 1.0000 | ORAL_TABLET | Freq: Two times a day (BID) | ORAL | 0 refills | Status: DC

## 2024-07-21 MED ORDER — LACTATED RINGERS IV BOLUS
1000.0000 mL | Freq: Once | INTRAVENOUS | Status: AC
  Administered 2024-07-21: 1000 mL via INTRAVENOUS

## 2024-07-21 MED ORDER — DIPHENHYDRAMINE HCL 50 MG/ML IJ SOLN
12.5000 mg | Freq: Once | INTRAMUSCULAR | Status: AC
  Administered 2024-07-21: 12.5 mg via INTRAVENOUS
  Filled 2024-07-21: qty 1

## 2024-07-21 MED ORDER — IOHEXOL 300 MG/ML  SOLN
100.0000 mL | Freq: Once | INTRAMUSCULAR | Status: AC | PRN
  Administered 2024-07-21: 100 mL via INTRAVENOUS

## 2024-07-21 MED ORDER — PROCHLORPERAZINE EDISYLATE 10 MG/2ML IJ SOLN
10.0000 mg | Freq: Once | INTRAMUSCULAR | Status: AC
  Administered 2024-07-21: 10 mg via INTRAVENOUS
  Filled 2024-07-21: qty 2

## 2024-07-21 NOTE — ED Triage Notes (Signed)
 Pt c/o abd pain and vomiting that started last night

## 2024-07-21 NOTE — ED Provider Notes (Signed)
 Ashley Soto Provider Note   CSN: [id number] Arrival date & time: 07/21/24  9146     Patient presents with: Abdominal Pain   Ashley Soto is a 43 y.o. female with history of PCOS, presents with concern for diffuse severe abdominal pain that started last night.  States this was also accompanied with some nausea and vomiting.  States she is vomiting up yellow appearing fluid.  No blood in the emesis.  Uncertain when last bowel movement was.  Denies any fever or chills.  Only previous abdominal surgery was a cesarean section.    Abdominal Pain      Prior to Admission medications   Medication Sig Start Date End Date Taking? Authorizing Provider  amoxicillin -clavulanate (AUGMENTIN ) 875-125 MG tablet Take 1 tablet by mouth 2 (two) times daily. 07/21/24  Yes Veta Palma, PA-C  ondansetron  (ZOFRAN -ODT) 4 MG disintegrating tablet Take 1 tablet (4 mg total) by mouth every 8 (eight) hours as needed for nausea or vomiting. 07/21/24  Yes Veta Palma, PA-C  aspirin 81 MG chewable tablet Chew 81 mg by mouth daily.    [provider]  calcium-vitamin D 250-100 MG-UNIT tablet Take 1 tablet by mouth daily.    [provider]  ferrous sulfate  325 (65 FE) MG tablet Take 325 mg by mouth daily with breakfast.    [provider]  FIRST-PROGESTERONE  VGS 200 MG SUPP PLACE 1 SUPPOSITORY (200 MG TOTAL) VAGINALLY AT BEDTIME. 10/04/21   Schuman, Christanna R, MD  Prenatal Vit-Fe Fumarate-FA (PRENATAL MULTIVITAMIN) TABS tablet Take 1 tablet by mouth daily at 12 noon.    [provider]  traZODone  (DESYREL ) 50 MG tablet Take 50-100 mg by mouth at bedtime. 10/19/18   [provider]  venlafaxine  XR (EFFEXOR -XR) 75 MG 24 hr capsule Take 150 mg by mouth daily. Patient not taking: No sig reported 10/20/18   [provider]    Allergies: Paroxetine hcl and Sulfa antibiotics    Review of Systems  Gastrointestinal:   Positive for abdominal pain.    Updated Vital Signs BP 105/63   Pulse 70   Temp (!) 97.5 F (36.4 C) (Oral)   Resp 16   Ht 5' 5 (1.651 m)   Wt 59 kg   SpO2 97%   BMI 21.63 kg/m   Physical Exam Vitals and nursing note reviewed.  Constitutional:      General: She is not in acute distress.    Appearance: She is well-developed.     Comments: Curled in bed, moaning No active vomiting  HENT:     Head: Normocephalic and atraumatic.  Eyes:     Conjunctiva/sclera: Conjunctivae normal.  Cardiovascular:     Rate and Rhythm: Normal rate and regular rhythm.     Heart sounds: No murmur heard. Pulmonary:     Effort: Pulmonary effort is normal. No respiratory distress.     Breath sounds: Normal breath sounds.  Abdominal:     Palpations: Abdomen is soft.     Tenderness: There is abdominal tenderness.     Comments: Abdomen diffusely tender to palpation.  No rebound or guarding.  No CVA tenderness bilaterally.  Musculoskeletal:        General: No swelling.     Cervical back: Neck supple.  Skin:    General: Skin is warm and dry.     Capillary Refill: Capillary refill takes less than 2 seconds.  Neurological:     Mental Status: She is alert.  Psychiatric:  Mood and Affect: Mood normal.     (all labs ordered are listed, but only abnormal results are displayed) Labs Reviewed  CBC WITH DIFFERENTIAL/PLATELET - Abnormal; Notable for the following components:      Result Value   WBC 17.3 (*)    Neutro Abs 14.4 (*)    Abs Immature Granulocytes 0.13 (*)    All other components within normal limits  COMPREHENSIVE METABOLIC PANEL WITH GFR - Abnormal; Notable for the following components:   Sodium 134 (*)    CO2 21 (*)    Glucose, Bld 134 (*)    All other components within normal limits  URINALYSIS, ROUTINE W REFLEX MICROSCOPIC - Abnormal; Notable for the following components:   Ketones, ur 20 (*)    Protein, ur 30 (*)    All other components within normal limits  LIPASE,  BLOOD  HCG, QUANTITATIVE, PREGNANCY    EKG: None  Radiology: US  Pelvis Complete Result Date: 07/21/2024 CLINICAL DATA:  eval endometrial thickening on CT. EXAM: TRANSABDOMINAL AND TRANSVAGINAL ULTRASOUND OF PELVIS TECHNIQUE: Both transabdominal and transvaginal ultrasound examinations of the pelvis were performed. Transabdominal technique was performed for global imaging of the pelvis including uterus, ovaries, adnexal regions, and pelvic cul-de-sac. It was necessary to proceed with endovaginal exam following the transabdominal exam to visualize the uterus and endometrium. COMPARISON:  CT scan abdomen and pelvis from earlier the same day. FINDINGS: Uterus Measurements: 4.3 x 4.3 x 7.8 cm = volume: 74.3 mL. No fibroids or other mass visualized. Endometrium Thickness: 6.6 mm.  No focal abnormality visualized. Right ovary Measurements: 2.3 x 2.8 x 3.3 cm = volume: 11.1 mL. Normal appearance/no adnexal mass. Left ovary Measurements: 1.9 x 2.2 x 3.6 cm = volume: 8.1 mL. Normal appearance/no adnexal mass. Other findings No abnormal free fluid. IMPRESSION: *Unremarkable pelvic ultrasound exam. Electronically Signed   By: Ree Molt M.D.   On: 07/21/2024 12:36   US  Transvaginal Non-OB Result Date: 07/21/2024 CLINICAL DATA:  eval endometrial thickening on CT. EXAM: TRANSABDOMINAL AND TRANSVAGINAL ULTRASOUND OF PELVIS TECHNIQUE: Both transabdominal and transvaginal ultrasound examinations of the pelvis were performed. Transabdominal technique was performed for global imaging of the pelvis including uterus, ovaries, adnexal regions, and pelvic cul-de-sac. It was necessary to proceed with endovaginal exam following the transabdominal exam to visualize the uterus and endometrium. COMPARISON:  CT scan abdomen and pelvis from earlier the same day. FINDINGS: Uterus Measurements: 4.3 x 4.3 x 7.8 cm = volume: 74.3 mL. No fibroids or other mass visualized. Endometrium Thickness: 6.6 mm.  No focal abnormality visualized.  Right ovary Measurements: 2.3 x 2.8 x 3.3 cm = volume: 11.1 mL. Normal appearance/no adnexal mass. Left ovary Measurements: 1.9 x 2.2 x 3.6 cm = volume: 8.1 mL. Normal appearance/no adnexal mass. Other findings No abnormal free fluid. IMPRESSION: *Unremarkable pelvic ultrasound exam. Electronically Signed   By: Ree Molt M.D.   On: 07/21/2024 12:36   CT ABDOMEN PELVIS W CONTRAST Result Date: 07/21/2024 CLINICAL DATA:  Abdominal pain. EXAM: CT ABDOMEN AND PELVIS WITH CONTRAST TECHNIQUE: Multidetector CT imaging of the abdomen and pelvis was performed using the standard protocol following bolus administration of intravenous contrast. RADIATION DOSE REDUCTION: This exam was performed according to the departmental dose-optimization program which includes automated exposure control, adjustment of the mA and/or kV according to patient size and/or use of iterative reconstruction technique. CONTRAST:  100mL OMNIPAQUE  IOHEXOL  300 MG/ML  SOLN COMPARISON:  None Available. FINDINGS: Lower chest: Heart is normal size.  Lung bases are clear.  Hepatobiliary: Liver, gallbladder and biliary tree are normal. Pancreas: Normal. Spleen: Normal. Adrenals/Urinary Tract: Adrenal glands are normal. Kidneys are normal in size without hydronephrosis or nephrolithiasis. Ureters and bladder are normal. Stomach/Bowel: Stomach is normal. Small bowel is normal in caliber and demonstrates minimal wall thickening over several jejunal loops in the left abdomen which is nonspecific. No adjacent free fluid or inflammatory change. Appendix is normal. Colon is normal. Vascular/Lymphatic: Abdominal aorta is normal in caliber. Remaining vascular structures are unremarkable. No significant adenopathy. Reproductive: Uterus normal size with mild prominence of the endometrium which is not well evaluated by CT. Ovaries are unremarkable. Other: No significant free fluid or focal inflammatory change. Musculoskeletal: No focal abnormality. IMPRESSION: 1.  Minimal wall thickening over several jejunal loops in the left abdomen which is nonspecific and may be due to mild enteritis of infectious or inflammatory nature. No adjacent free fluid or inflammatory change. 2. Mild prominence of the endometrium which is not well evaluated by CT. Pelvic ultrasound would be helpful for further evaluation. Electronically Signed   By: Toribio Agreste M.D.   On: 07/21/2024 10:45     Procedures   Medications Ordered in the ED  amoxicillin -clavulanate (AUGMENTIN ) 875-125 MG per tablet 1 tablet (has no administration in time range)  HYDROmorphone  (DILAUDID ) injection 0.5 mg (has no administration in time range)  fentaNYL  (SUBLIMAZE ) injection 50 mcg (50 mcg Intravenous Given 07/21/24 0913)  ondansetron  (ZOFRAN ) injection 4 mg (4 mg Intravenous Given 07/21/24 0938)  HYDROmorphone  (DILAUDID ) injection 1 mg (1 mg Intravenous Given 07/21/24 1012)  iohexol  (OMNIPAQUE ) 300 MG/ML solution 100 mL (100 mLs Intravenous Contrast Given 07/21/24 1017)  lactated ringers  bolus 1,000 mL (1,000 mLs Intravenous Bolus 07/21/24 1127)  ketorolac  (TORADOL ) 15 MG/ML injection 15 mg (15 mg Intravenous Given 07/21/24 1236)  prochlorperazine  (COMPAZINE ) injection 10 mg (10 mg Intravenous Given 07/21/24 1239)  diphenhydrAMINE  (BENADRYL ) injection 12.5 mg (12.5 mg Intravenous Given 07/21/24 1238)                                    Medical Decision Making Amount and/or Complexity of Data Reviewed Labs: ordered. Radiology: ordered.  Risk Prescription drug management.     Differential diagnosis includes but is not limited to Acute cholecystitis, cholelithiasis, cholangitis, choledocholithiasis, peptic ulcer, gastritis, gastroenteritis, appendicitis, IBS, IBD, DKA, nephrolithiasis, UTI, pyelonephritis, pancreatitis, diverticulitis, mesenteric ischemia, abdominal aortic aneurysm, small bowel obstruction, volvulus, ovarian torsion and pregnancy related concerns in females of childbearing age    ED  Course:  Upon initial evaluation, patient is curled in the bed, moaning in pain.  Abdomen with mild diffuse tenderness to palpation, no rebound or guarding.  Patient not actively vomiting.  Labs Ordered: I Ordered, and personally interpreted labs.  The pertinent results include:   CBC with leukocytosis of 17.3 CMP without any elevation in creatinine or LFTs.  Normal electrolytes Lipase within normal limits Urinalysis with ketones and protein present.  No signs of infection Urine Ossey negative  Imaging Studies ordered: I ordered imaging studies including CT abdomen and pelvis I independently visualized the imaging with scope of interpretation limited to determining acute life threatening conditions related to emergency care. Imaging showed   CT abdomen and pelvis IMPRESSION: 1. Minimal wall thickening over several jejunal loops in the left abdomen which is nonspecific and may be due to mild enteritis of infectious or inflammatory nature. No adjacent free fluid or inflammatory change. 2. Mild prominence of the  endometrium which is not well evaluated by CT. Pelvic ultrasound would be helpful for further evaluation.  Pelvic ultrasound obtained and showed no acute abnormalities I agree with the radiologist interpretation   Medications Given: Fentanyl , Dilaudid , Toradol  Benadryl , compazine , zofran  LR bolus Augmentin   Upon re-evaluation, patient reports she is feeling better with the pain medications.  Has not had any further vomiting, feels less nauseous.  She was p.o. challenged with crackers and water  which she was able to tolerate.  Discussed that her CT showed some minimal wall thickening over the jejunum, likely mild enteritis.  Seems to be where patient's pain is coming from and consistent with her symptoms.  Will cover with course of Augmentin  given elevated WBC count in case of bacterial etiology. She does not have any other acute abnormalities noted on her CT scan.  Her lipase,  creatinine, and LFTs are within normal limits.  No electrolyte abnormalities.  There was a mild predominance in the endometrium noted on her CT scan, but when evaluated with pelvic ultrasound is recommended, no acute abnormalities were noted.  Urinalysis without signs of infection.  Low concern for UTI or pyelonephritis.  Patient stable and appropriate for discharge home    Impression: Enteritis  Disposition:  The patient was discharged home with instructions to eat a bland diet and advance diet as tolerated.  Tylenol  and ibuprofen  as needed for pain. Take 5 day course of Augmentin  as prescribed.  Follow-up with PCP if symptoms not improving within the next 5 days. Return precautions given.   This chart was dictated using voice recognition software, Dragon. Despite the best efforts of this provider to proofread and correct errors, errors may still occur which can change documentation meaning.       Final diagnoses:  Enteritis    ED Discharge Orders          Ordered    amoxicillin -clavulanate (AUGMENTIN ) 875-125 MG tablet  2 times daily        07/21/24 1341    ondansetron  (ZOFRAN -ODT) 4 MG disintegrating tablet  Every 8 hours PRN        07/21/24 1341               Veta Palma, PA-C 07/21/24 1351    Suzette Pac, MD 07/23/24 1352

## 2024-07-21 NOTE — Discharge Instructions (Addendum)
 You were found to have inflammation of your jejunum which is part of your small intestines.  Usually, this is caused by a virus.  However, you did have an elevated white blood cell count on your labs which could indicate more of a bacterial cause.  You have been prescribed an antibiotic called Augmentin . Take this antibiotic 2 times a day for the next 5 days. Take the full course of your antibiotic even if you start feeling better. Antibiotics may cause you to have diarrhea.  Symptoms typically resolve on their own within the next 2-3 days. Please eat a bland diet with foods such as rice, toast, crackers, and then advance your diet as tolerated.  You have been prescribed Zofran  (ondansetron ) for nausea and vomiting. You may take this every 8 hours as needed for nausea and vomiting. This medication dissolves under the tongue. You do not need to swallow it.   You may take up to 1000mg  of tylenol  every 6 hours as needed for pain.  Do not take more then 4g per day.  You may use up to 600mg  ibuprofen  every 6 hours as needed for pain.  Do not exceed 2.4g of ibuprofen  per day.   Follow-up instructions: Please follow-up with your primary care provider for further evaluation of your symptoms if you are not feeling better within the next 5 days.   Return instructions:  Please return to the Emergency Department if you experience worsening symptoms.  RETURN IMMEDIATELY IF you develop shortness of breath, confusion or altered mental status, a new rash, become dizzy, faint, or poorly responsive, or are unable to be cared for at home. Please return if you have persistent vomiting and cannot keep down fluids or develop a fever that is not controlled by tylenol  or motrin .   Please return if you have any other emergent concerns.

## 2024-07-21 NOTE — ED Notes (Signed)
 Patient transported to CT

## 2024-07-22 ENCOUNTER — Emergency Department (HOSPITAL_COMMUNITY)
Admission: EM | Admit: 2024-07-22 | Discharge: 2024-07-22 | Disposition: A | Discharge status: Home / Self Care | Attending: Student | Admitting: Student

## 2024-07-22 ENCOUNTER — Other Ambulatory Visit: Payer: Self-pay

## 2024-07-22 DIAGNOSIS — F17210 Nicotine dependence, cigarettes, uncomplicated: Secondary | ICD-10-CM | POA: Insufficient documentation

## 2024-07-22 DIAGNOSIS — Z7982 Long term (current) use of aspirin: Secondary | ICD-10-CM | POA: Insufficient documentation

## 2024-07-22 DIAGNOSIS — K529 Noninfective gastroenteritis and colitis, unspecified: Secondary | ICD-10-CM | POA: Insufficient documentation

## 2024-07-22 DIAGNOSIS — R109 Unspecified abdominal pain: Secondary | ICD-10-CM

## 2024-07-22 LAB — CBC WITH DIFFERENTIAL/PLATELET
Abs Immature Granulocytes: 0.09 K/uL — ABNORMAL HIGH (ref 0.00–0.07)
Basophils Absolute: 0 K/uL (ref 0.0–0.1)
Basophils Relative: 0 %
Eosinophils Absolute: 0 K/uL (ref 0.0–0.5)
Eosinophils Relative: 0 %
HCT: 44 % (ref 36.0–46.0)
Hemoglobin: 15.3 g/dL — ABNORMAL HIGH (ref 12.0–15.0)
Immature Granulocytes: 1 %
Lymphocytes Relative: 22 %
Lymphs Abs: 3.1 K/uL (ref 0.7–4.0)
MCH: 30.9 pg (ref 26.0–34.0)
MCHC: 34.8 g/dL (ref 30.0–36.0)
MCV: 88.9 fL (ref 80.0–100.0)
Monocytes Absolute: 0.9 K/uL (ref 0.1–1.0)
Monocytes Relative: 6 %
Neutro Abs: 10.1 K/uL — ABNORMAL HIGH (ref 1.7–7.7)
Neutrophils Relative %: 71 %
Platelets: 331 K/uL (ref 150–400)
RBC: 4.95 MIL/uL (ref 3.87–5.11)
RDW: 12.8 % (ref 11.5–15.5)
WBC: 14.2 K/uL — ABNORMAL HIGH (ref 4.0–10.5)
nRBC: 0 % (ref 0.0–0.2)

## 2024-07-22 LAB — COMPREHENSIVE METABOLIC PANEL WITH GFR
ALT: 18 U/L (ref 0–44)
AST: 21 U/L (ref 15–41)
Albumin: 3.7 g/dL (ref 3.5–5.0)
Alkaline Phosphatase: 54 U/L (ref 38–126)
Anion gap: 12 (ref 5–15)
BUN: 15 mg/dL (ref 6–20)
CO2: 23 mmol/L (ref 22–32)
Calcium: 9.4 mg/dL (ref 8.9–10.3)
Chloride: 101 mmol/L (ref 98–111)
Creatinine, Ser: 0.7 mg/dL (ref 0.44–1.00)
GFR, Estimated: >60 mL/min (ref >60)
Glucose, Bld: 113 mg/dL — ABNORMAL HIGH (ref 70–99)
Potassium: 3.9 mmol/L (ref 3.5–5.1)
Sodium: 136 mmol/L (ref 135–145)
Total Bilirubin: 1 mg/dL (ref 0.0–1.2)
Total Protein: 6.9 g/dL (ref 6.5–8.1)

## 2024-07-22 MED ORDER — LORAZEPAM 2 MG/ML IJ SOLN
0.5000 mg | Freq: Once | INTRAMUSCULAR | Status: AC
  Administered 2024-07-22: 0.5 mg via INTRAVENOUS
  Filled 2024-07-22: qty 1

## 2024-07-22 MED ORDER — DICYCLOMINE HCL 20 MG PO TABS: 20.0000 mg | ORAL_TABLET | Freq: Two times a day (BID) | ORAL | 0 refills | Status: AC

## 2024-07-22 MED ORDER — DROPERIDOL 2.5 MG/ML IJ SOLN
1.2500 mg | Freq: Once | INTRAMUSCULAR | Status: AC
  Administered 2024-07-22: 1.25 mg via INTRAVENOUS
  Filled 2024-07-22: qty 2

## 2024-07-22 MED ORDER — DICYCLOMINE HCL 20 MG PO TABS: 20.0000 mg | ORAL_TABLET | Freq: Two times a day (BID) | ORAL | 0 refills | Status: DC

## 2024-07-22 NOTE — ED Provider Notes (Signed)
 [place] EMERGENCY DEPARTMENT AT [facility] Provider Note  CSN: [id number] Arrival date & time: 07/22/24 0207  Chief Complaint(s) Abdominal Pain  HPI Ashley Soto is a 43 y.o. female with PMH anxiety, depression, PCOS, benzodiazepine withdrawal seizures who presents emerged part for evaluation of abdominal pain.  Patient was seen yesterday morning for similar complaints and diagnosed with enteritis with a negative CT and pelvic ultrasound.  States that symptoms worsened tonight and return to the emergency department for further evaluation.  Denies chest pain, shortness of breath, headache, fever or other systemic symptoms.   Past Medical History Past Medical History:  Diagnosis Date   Anxiety and depression    History of seizures     D/T  BENZODIAZEPINE WITHDRAWAL. only had one seizure   History of substance abuse (HCC)    ALCOHOL AND DRUG FREE FOR 4 YEARS.  INVOLVED WITH AA   Polycystic disease, ovaries    Tinea corporis    Tobacco abuse    Patient Active Problem List   Diagnosis Date Noted   Supervision of high risk pregnancy, antepartum 06/13/2021   Advanced maternal age, primigravida 06/13/2021   Dichorionic diamniotic twin pregnancy 06/13/2021   Bilateral temporomandibular joint disorder 05/16/2018   History of seizure 05/16/2018   History of substance abuse (HCC) 05/16/2018   Active preterm labor 09/14/2016   Preterm labor 09/14/2016   Anxiety state 11/25/2014   Cystic acne 11/25/2014   Primary insomnia 11/25/2014   Home Medication(s) Prior to Admission medications   Medication Sig Start Date End Date Taking? Authorizing Provider  amoxicillin -clavulanate (AUGMENTIN ) 875-125 MG tablet Take 1 tablet by mouth 2 (two) times daily. 07/21/24   Veta Palma, PA-C  aspirin 81 MG chewable tablet Chew 81 mg by mouth daily.    [provider]  calcium-vitamin D 250-100 MG-UNIT tablet Take 1 tablet by mouth daily.    [provider]  dicyclomine   (BENTYL ) 20 MG tablet Take 1 tablet (20 mg total) by mouth 2 (two) times daily. 07/22/24   Deina Lipsey, MD  ferrous sulfate  325 (65 FE) MG tablet Take 325 mg by mouth daily with breakfast.    [provider]  FIRST-PROGESTERONE  VGS 200 MG SUPP PLACE 1 SUPPOSITORY (200 MG TOTAL) VAGINALLY AT BEDTIME. 10/04/21   Schuman, Christanna R, MD  ondansetron  (ZOFRAN -ODT) 4 MG disintegrating tablet Take 1 tablet (4 mg total) by mouth every 8 (eight) hours as needed for nausea or vomiting. 07/21/24   Veta Palma, PA-C  Prenatal Vit-Fe Fumarate-FA (PRENATAL MULTIVITAMIN) TABS tablet Take 1 tablet by mouth daily at 12 noon.    [provider]  traZODone  (DESYREL ) 50 MG tablet Take 50-100 mg by mouth at bedtime. 10/19/18   [provider]  venlafaxine  XR (EFFEXOR -XR) 75 MG 24 hr capsule Take 150 mg by mouth daily. Patient not taking: No sig reported 10/20/18   [provider]  Past Surgical History Past Surgical History:  Procedure Laterality Date   DILATION AND CURETTAGE OF UTERUS     DILATION AND CURETTAGE OF UTERUS N/A 03/19/2016   Procedure: SUCTION DILATATION AND CURETTAGE;  Surgeon: Bebe Furry, MD;  Location: [facility] ORS;  Service: Gynecology;  Laterality: N/A;   DORSAL COMPARTMENT RELEASE Left 12/28/2020   Procedure: Everitt Curt release, left;  Surgeon: Kathlynn Sharper, MD;  Location: [facility] ORS;  Service: Orthopedics;  Laterality: Left;   TONSILLECTOMY     Family History Family History  Problem Relation Age of Onset   Breast cancer Mother        early 95s   Breast cancer Paternal Grandmother        early 62s    Social History Social History   Tobacco Use   Smoking status: Every Day    Current packs/day: 0.50    Types: Cigarettes   Smokeless tobacco: Never  Vaping Use   Vaping status: Never Used  Substance Use Topics    Alcohol use: Not Currently    Comment: ALCOHOL FREE X 4 YEARS   Drug use: No    Comment: DRUG FREE X 4 YEARS   Allergies Paroxetine hcl and Sulfa antibiotics  Review of Systems Review of Systems  Gastrointestinal:  Positive for abdominal pain.    Physical Exam Vital Signs  I have reviewed the triage vital signs BP 121/75   Pulse (!) 59   Temp 98.4 F (36.9 C)   Resp 11   SpO2 97%   Physical Exam Vitals and nursing note reviewed.  Constitutional:      General: She is not in acute distress.    Appearance: She is well-developed.  HENT:     Head: Normocephalic and atraumatic.  Eyes:     Conjunctiva/sclera: Conjunctivae normal.  Cardiovascular:     Rate and Rhythm: Normal rate and regular rhythm.     Heart sounds: No murmur heard. Pulmonary:     Effort: Pulmonary effort is normal. No respiratory distress.     Breath sounds: Normal breath sounds.  Abdominal:     Palpations: Abdomen is soft.     Tenderness: There is generalized abdominal tenderness.  Musculoskeletal:        General: No swelling.     Cervical back: Neck supple.  Skin:    General: Skin is warm and dry.     Capillary Refill: Capillary refill takes less than 2 seconds.  Neurological:     Mental Status: She is alert.  Psychiatric:        Mood and Affect: Mood normal.     ED Results and Treatments Labs (all labs ordered are listed, but only abnormal results are displayed) Labs Reviewed  COMPREHENSIVE METABOLIC PANEL WITH GFR - Abnormal; Notable for the following components:      Result Value   Glucose, Bld 113 (*)    All other components within normal limits  CBC WITH DIFFERENTIAL/PLATELET - Abnormal; Notable for the following components:   WBC 14.2 (*)    Hemoglobin 15.3 (*)    Neutro Abs 10.1 (*)    Abs Immature Granulocytes 0.09 (*)    All other components within normal limits  Radiology US  Pelvis Complete Result Date: 07/21/2024 CLINICAL DATA:  eval endometrial thickening on CT. EXAM: TRANSABDOMINAL AND TRANSVAGINAL ULTRASOUND OF PELVIS TECHNIQUE: Both transabdominal and transvaginal ultrasound examinations of the pelvis were performed. Transabdominal technique was performed for global imaging of the pelvis including uterus, ovaries, adnexal regions, and pelvic cul-de-sac. It was necessary to proceed with endovaginal exam following the transabdominal exam to visualize the uterus and endometrium. COMPARISON:  CT scan abdomen and pelvis from earlier the same day. FINDINGS: Uterus Measurements: 4.3 x 4.3 x 7.8 cm = volume: 74.3 mL. No fibroids or other mass visualized. Endometrium Thickness: 6.6 mm.  No focal abnormality visualized. Right ovary Measurements: 2.3 x 2.8 x 3.3 cm = volume: 11.1 mL. Normal appearance/no adnexal mass. Left ovary Measurements: 1.9 x 2.2 x 3.6 cm = volume: 8.1 mL. Normal appearance/no adnexal mass. Other findings No abnormal free fluid. IMPRESSION: *Unremarkable pelvic ultrasound exam. Electronically Signed   By: Ree Molt M.D.   On: 07/21/2024 12:36   US  Transvaginal Non-OB Result Date: 07/21/2024 CLINICAL DATA:  eval endometrial thickening on CT. EXAM: TRANSABDOMINAL AND TRANSVAGINAL ULTRASOUND OF PELVIS TECHNIQUE: Both transabdominal and transvaginal ultrasound examinations of the pelvis were performed. Transabdominal technique was performed for global imaging of the pelvis including uterus, ovaries, adnexal regions, and pelvic cul-de-sac. It was necessary to proceed with endovaginal exam following the transabdominal exam to visualize the uterus and endometrium. COMPARISON:  CT scan abdomen and pelvis from earlier the same day. FINDINGS: Uterus Measurements: 4.3 x 4.3 x 7.8 cm = volume: 74.3 mL. No fibroids or other mass visualized. Endometrium Thickness: 6.6 mm.  No focal abnormality visualized. Right ovary Measurements: 2.3 x 2.8 x 3.3 cm =  volume: 11.1 mL. Normal appearance/no adnexal mass. Left ovary Measurements: 1.9 x 2.2 x 3.6 cm = volume: 8.1 mL. Normal appearance/no adnexal mass. Other findings No abnormal free fluid. IMPRESSION: *Unremarkable pelvic ultrasound exam. Electronically Signed   By: Ree Molt M.D.   On: 07/21/2024 12:36   CT ABDOMEN PELVIS W CONTRAST Result Date: 07/21/2024 CLINICAL DATA:  Abdominal pain. EXAM: CT ABDOMEN AND PELVIS WITH CONTRAST TECHNIQUE: Multidetector CT imaging of the abdomen and pelvis was performed using the standard protocol following bolus administration of intravenous contrast. RADIATION DOSE REDUCTION: This exam was performed according to the departmental dose-optimization program which includes automated exposure control, adjustment of the mA and/or kV according to patient size and/or use of iterative reconstruction technique. CONTRAST:  OMNIPAQUE  IOHEXOL  300 MG/ML  SOLN COMPARISON:  None Available. FINDINGS: Lower chest: Heart is normal size.  Lung bases are clear. Hepatobiliary: Liver, gallbladder and biliary tree are normal. Pancreas: Normal. Spleen: Normal. Adrenals/Urinary Tract: Adrenal glands are normal. Kidneys are normal in size without hydronephrosis or nephrolithiasis. Ureters and bladder are normal. Stomach/Bowel: Stomach is normal. Small bowel is normal in caliber and demonstrates minimal wall thickening over several jejunal loops in the left abdomen which is nonspecific. No adjacent free fluid or inflammatory change. Appendix is normal. Colon is normal. Vascular/Lymphatic: Abdominal aorta is normal in caliber. Remaining vascular structures are unremarkable. No significant adenopathy. Reproductive: Uterus normal size with mild prominence of the endometrium which is not well evaluated by CT. Ovaries are unremarkable. Other: No significant free fluid or focal inflammatory change. Musculoskeletal: No focal abnormality. IMPRESSION: 1. Minimal wall thickening over several jejunal loops  in the left abdomen which is nonspecific and may be due to mild enteritis of infectious or inflammatory nature. No adjacent free fluid or inflammatory change. 2. Mild prominence  of the endometrium which is not well evaluated by CT. Pelvic ultrasound would be helpful for further evaluation. Electronically Signed   By: Toribio Agreste M.D.   On: 07/21/2024 10:45    Pertinent labs & imaging results that were available during my care of the patient were reviewed by me and considered in my medical decision making (see MDM for details).  Medications Ordered in ED Medications  droperidol  (INAPSINE ) 2.5 MG/ML injection 1.25 mg (1.25 mg Intravenous Given 07/22/24 0235)  LORazepam  (ATIVAN ) injection 0.5 mg (0.5 mg Intravenous Given 07/22/24 0235)                                                                                                                                     Procedures Procedures  (including critical care time)  Medical Decision Making / ED Course   This patient presents to the ED for concern of abdominal pain, this involves an extensive number of treatment options, and is a complaint that carries with it a high risk of complications and morbidity.  The differential diagnosis includes GERD/gastritis, peptic ulcer disease, pancreatitis, gastroparesis, enteritis  MDM: Patient seen emerged part for evaluation of abdominal pain.  Physical exam with generalized tenderness of the abdomen worse in the epigastrium.  Laboratory evaluation with leukocytosis to 14.2 which is downtrending from yesterday but is otherwise unremarkable.  Patient already has a known diagnosis of enteritis confirmed on CT yesterday and we will not repeat imaging.  Droperidol  and Ativan  given for symptom control and on reevaluation symptoms have significantly improved.  She is able to tolerate p.o. without difficulty.  Will be started on Bentyl .  At this time she does not meet inpatient criteria for admission and be discharged  with outpatient follow-up.  Return precautions given of which she voiced understanding.   Additional history obtained: -Additional history obtained from partner -External records from outside source obtained and reviewed including: Chart review including previous notes, labs, imaging, consultation notes   Lab Tests: -I ordered, reviewed, and interpreted labs.   The pertinent results include:   Labs Reviewed  COMPREHENSIVE METABOLIC PANEL WITH GFR - Abnormal; Notable for the following components:      Result Value   Glucose, Bld 113 (*)    All other components within normal limits  CBC WITH DIFFERENTIAL/PLATELET - Abnormal; Notable for the following components:   WBC 14.2 (*)    Hemoglobin 15.3 (*)    Neutro Abs 10.1 (*)    Abs Immature Granulocytes 0.09 (*)    All other components within normal limits      Medicines ordered and prescription drug management: Meds ordered this encounter  Medications   droperidol  (INAPSINE ) 2.5 MG/ML injection 1.25 mg   LORazepam  (ATIVAN ) injection 0.5 mg   DISCONTD: dicyclomine  (BENTYL ) 20 MG tablet    Sig: Take 1 tablet (20 mg total) by mouth 2 (two) times daily.    Dispense:  20 tablet  Refill:  0   dicyclomine  (BENTYL ) 20 MG tablet    Sig: Take 1 tablet (20 mg total) by mouth 2 (two) times daily.    Dispense:  20 tablet    Refill:  0    -I have reviewed the patients home medicines and have made adjustments as needed  Critical interventions none  Cardiac Monitoring: The patient was maintained on a cardiac monitor.  I personally viewed and interpreted the cardiac monitored which showed an underlying rhythm of: NSR  Social Determinants of Health:  Factors impacting patients care include: none   Reevaluation: After the interventions noted above, I reevaluated the patient and found that they have :improved  Co morbidities that complicate the patient evaluation  Past Medical History:  Diagnosis Date   Anxiety and depression     History of seizures     D/T  BENZODIAZEPINE WITHDRAWAL. only had one seizure   History of substance abuse (HCC)    ALCOHOL AND DRUG FREE FOR 4 YEARS.  INVOLVED WITH AA   Polycystic disease, ovaries    Tinea corporis    Tobacco abuse       Dispostion: I considered admission for this patient, but at this time she does not meet inpatient criteria for admission will be discharged with outpatient follow-up     Final Clinical Impression(s) / ED Diagnoses Final diagnoses:  Abdominal pain, unspecified abdominal location  Enteritis     @PCDICTATION @    Albertina Dixon, MD 07/22/24 [phone]

## 2024-07-22 NOTE — ED Triage Notes (Signed)
 Pt c/o abdominal pain and N/V. Was seen yesterday for same and dx with enteritis, pt states the pain has gotten worse tonight. States she has not had a bowel movement in several days

## 2024-07-22 NOTE — ED Notes (Signed)
 ED Provider at bedside.

## 2024-07-23 ENCOUNTER — Other Ambulatory Visit: Payer: Self-pay

## 2024-07-23 ENCOUNTER — Inpatient Hospital Stay (HOSPITAL_COMMUNITY)
Admission: EM | Admit: 2024-07-23 | Discharge: 2024-07-28 | DRG: 419 | Disposition: A | Discharge status: Home / Self Care | Attending: Family Medicine | Admitting: Family Medicine

## 2024-07-23 ENCOUNTER — Emergency Department (HOSPITAL_COMMUNITY)

## 2024-07-23 ENCOUNTER — Encounter (HOSPITAL_COMMUNITY): Payer: Self-pay | Admitting: Family Medicine

## 2024-07-23 ENCOUNTER — Observation Stay (HOSPITAL_COMMUNITY)

## 2024-07-23 DIAGNOSIS — K810 Acute cholecystitis: Secondary | ICD-10-CM

## 2024-07-23 DIAGNOSIS — Z7982 Long term (current) use of aspirin: Secondary | ICD-10-CM

## 2024-07-23 DIAGNOSIS — F411 Generalized anxiety disorder: Secondary | ICD-10-CM | POA: Diagnosis present

## 2024-07-23 DIAGNOSIS — F17210 Nicotine dependence, cigarettes, uncomplicated: Secondary | ICD-10-CM | POA: Diagnosis present

## 2024-07-23 DIAGNOSIS — F32A Depression, unspecified: Secondary | ICD-10-CM | POA: Diagnosis present

## 2024-07-23 DIAGNOSIS — K812 Acute cholecystitis with chronic cholecystitis: Principal | ICD-10-CM | POA: Diagnosis present

## 2024-07-23 DIAGNOSIS — K529 Noninfective gastroenteritis and colitis, unspecified: Secondary | ICD-10-CM | POA: Diagnosis present

## 2024-07-23 DIAGNOSIS — Z888 Allergy status to other drugs, medicaments and biological substances status: Secondary | ICD-10-CM

## 2024-07-23 DIAGNOSIS — I959 Hypotension, unspecified: Secondary | ICD-10-CM | POA: Diagnosis not present

## 2024-07-23 DIAGNOSIS — K660 Peritoneal adhesions (postprocedural) (postinfection): Secondary | ICD-10-CM | POA: Diagnosis present

## 2024-07-23 DIAGNOSIS — R112 Nausea with vomiting, unspecified: Secondary | ICD-10-CM | POA: Diagnosis not present

## 2024-07-23 DIAGNOSIS — R7989 Other specified abnormal findings of blood chemistry: Secondary | ICD-10-CM | POA: Diagnosis present

## 2024-07-23 DIAGNOSIS — Z87898 Personal history of other specified conditions: Secondary | ICD-10-CM

## 2024-07-23 DIAGNOSIS — Z803 Family history of malignant neoplasm of breast: Secondary | ICD-10-CM

## 2024-07-23 DIAGNOSIS — Z79899 Other long term (current) drug therapy: Secondary | ICD-10-CM

## 2024-07-23 DIAGNOSIS — R109 Unspecified abdominal pain: Secondary | ICD-10-CM | POA: Diagnosis not present

## 2024-07-23 DIAGNOSIS — E282 Polycystic ovarian syndrome: Secondary | ICD-10-CM | POA: Diagnosis present

## 2024-07-23 DIAGNOSIS — R1084 Generalized abdominal pain: Principal | ICD-10-CM

## 2024-07-23 DIAGNOSIS — Z882 Allergy status to sulfonamides status: Secondary | ICD-10-CM

## 2024-07-23 DIAGNOSIS — F1911 Other psychoactive substance abuse, in remission: Secondary | ICD-10-CM | POA: Diagnosis present

## 2024-07-23 DIAGNOSIS — R739 Hyperglycemia, unspecified: Secondary | ICD-10-CM

## 2024-07-23 DIAGNOSIS — R569 Unspecified convulsions: Secondary | ICD-10-CM | POA: Diagnosis present

## 2024-07-23 DIAGNOSIS — F5101 Primary insomnia: Secondary | ICD-10-CM | POA: Diagnosis present

## 2024-07-23 LAB — COMPREHENSIVE METABOLIC PANEL WITH GFR
ALT: 17 U/L (ref 0–44)
AST: 17 U/L (ref 15–41)
Albumin: 3.8 g/dL (ref 3.5–5.0)
Alkaline Phosphatase: 51 U/L (ref 38–126)
Anion gap: 11 (ref 5–15)
BUN: 12 mg/dL (ref 6–20)
CO2: 25 mmol/L (ref 22–32)
Calcium: 9 mg/dL (ref 8.9–10.3)
Chloride: 101 mmol/L (ref 98–111)
Creatinine, Ser: 0.69 mg/dL (ref 0.44–1.00)
GFR, Estimated: >60 mL/min (ref >60)
Glucose, Bld: 116 mg/dL — ABNORMAL HIGH (ref 70–99)
Potassium: 4.2 mmol/L (ref 3.5–5.1)
Sodium: 137 mmol/L (ref 135–145)
Total Bilirubin: 1.2 mg/dL (ref 0.0–1.2)
Total Protein: 7 g/dL (ref 6.5–8.1)

## 2024-07-23 LAB — CBC WITH DIFFERENTIAL/PLATELET
Abs Immature Granulocytes: 0.06 K/uL (ref 0.00–0.07)
Basophils Absolute: 0.1 K/uL (ref 0.0–0.1)
Basophils Relative: 1 %
Eosinophils Absolute: 0 K/uL (ref 0.0–0.5)
Eosinophils Relative: 0 %
HCT: 42.2 % (ref 36.0–46.0)
Hemoglobin: 14.6 g/dL (ref 12.0–15.0)
Immature Granulocytes: 1 %
Lymphocytes Relative: 21 %
Lymphs Abs: 2.5 K/uL (ref 0.7–4.0)
MCH: 31 pg (ref 26.0–34.0)
MCHC: 34.6 g/dL (ref 30.0–36.0)
MCV: 89.6 fL (ref 80.0–100.0)
Monocytes Absolute: 0.7 K/uL (ref 0.1–1.0)
Monocytes Relative: 6 %
Neutro Abs: 8.5 K/uL — ABNORMAL HIGH (ref 1.7–7.7)
Neutrophils Relative %: 71 %
Platelets: 333 K/uL (ref 150–400)
RBC: 4.71 MIL/uL (ref 3.87–5.11)
RDW: 12.4 % (ref 11.5–15.5)
WBC: 11.8 K/uL — ABNORMAL HIGH (ref 4.0–10.5)
nRBC: 0 % (ref 0.0–0.2)

## 2024-07-23 LAB — RAPID URINE DRUG SCREEN, HOSP PERFORMED
Amphetamines: POSITIVE — AB
Barbiturates: NOT DETECTED
Benzodiazepines: NOT DETECTED
Cocaine: NOT DETECTED
Opiates: POSITIVE — AB
Tetrahydrocannabinol: NOT DETECTED

## 2024-07-23 LAB — LIPASE, BLOOD: Lipase: 43 U/L (ref 11–51)

## 2024-07-23 LAB — HIV ANTIBODY (ROUTINE TESTING W REFLEX): HIV Screen 4th Generation wRfx: NONREACTIVE

## 2024-07-23 LAB — C-REACTIVE PROTEIN: CRP: 0.5 mg/dL (ref <1.0)

## 2024-07-23 LAB — MAGNESIUM: Magnesium: 1.7 mg/dL (ref 1.7–2.4)

## 2024-07-23 LAB — PHOSPHORUS: Phosphorus: 2.5 mg/dL (ref 2.5–4.6)

## 2024-07-23 LAB — PROCALCITONIN: Procalcitonin: <0.1 ng/mL

## 2024-07-23 LAB — LACTIC ACID, PLASMA: Lactic Acid, Venous: 1.5 mmol/L (ref 0.5–1.9)

## 2024-07-23 LAB — SEDIMENTATION RATE: Sed Rate: 5 mm/h (ref 0–20)

## 2024-07-23 MED ORDER — BUSPIRONE HCL 15 MG PO TABS
15.0000 mg | ORAL_TABLET | Freq: Two times a day (BID) | ORAL | Status: DC
  Administered 2024-07-23 – 2024-07-28 (×10): 15 mg via ORAL
  Filled 2024-07-23 (×7): qty 1
  Filled 2024-07-23: qty 3
  Filled 2024-07-23: qty 1

## 2024-07-23 MED ORDER — OXYCODONE HCL 5 MG PO TABS
5.0000 mg | ORAL_TABLET | ORAL | Status: DC | PRN
  Administered 2024-07-24 – 2024-07-26 (×3): 5 mg via ORAL
  Filled 2024-07-23 (×5): qty 1

## 2024-07-23 MED ORDER — HYDROMORPHONE HCL 1 MG/ML IJ SOLN
0.5000 mg | INTRAMUSCULAR | Status: DC | PRN
  Administered 2024-07-23 – 2024-07-28 (×26): 0.5 mg via INTRAVENOUS
  Filled 2024-07-23 (×26): qty 0.5

## 2024-07-23 MED ORDER — HEPARIN SODIUM (PORCINE) 5000 UNIT/ML IJ SOLN
5000.0000 [IU] | Freq: Three times a day (TID) | INTRAMUSCULAR | Status: DC
  Filled 2024-07-23 (×5): qty 1

## 2024-07-23 MED ORDER — SODIUM CHLORIDE 0.9% FLUSH
3.0000 mL | Freq: Two times a day (BID) | INTRAVENOUS | Status: DC
  Administered 2024-07-23 – 2024-07-27 (×9): 3 mL via INTRAVENOUS

## 2024-07-23 MED ORDER — SODIUM CHLORIDE 0.9 % IV BOLUS
1000.0000 mL | Freq: Once | INTRAVENOUS | Status: AC
  Administered 2024-07-23: 1000 mL via INTRAVENOUS

## 2024-07-23 MED ORDER — LEVALBUTEROL HCL 0.63 MG/3ML IN NEBU: 0.6300 mg | INHALATION_SOLUTION | Freq: Four times a day (QID) | RESPIRATORY_TRACT | Status: DC | PRN

## 2024-07-23 MED ORDER — SENNOSIDES-DOCUSATE SODIUM 8.6-50 MG PO TABS: 1.0000 | ORAL_TABLET | Freq: Every evening | ORAL | Status: DC | PRN

## 2024-07-23 MED ORDER — PANTOPRAZOLE SODIUM 40 MG PO TBEC
40.0000 mg | DELAYED_RELEASE_TABLET | Freq: Two times a day (BID) | ORAL | Status: DC
  Administered 2024-07-23 – 2024-07-28 (×10): 40 mg via ORAL
  Filled 2024-07-23 (×9): qty 1

## 2024-07-23 MED ORDER — MORPHINE SULFATE (PF) 4 MG/ML IV SOLN
4.0000 mg | Freq: Once | INTRAVENOUS | Status: AC
  Administered 2024-07-23: 4 mg via INTRAVENOUS
  Filled 2024-07-23: qty 1

## 2024-07-23 MED ORDER — METOCLOPRAMIDE HCL 5 MG/ML IJ SOLN
10.0000 mg | Freq: Three times a day (TID) | INTRAMUSCULAR | Status: DC
  Administered 2024-07-23 – 2024-07-27 (×13): 10 mg via INTRAVENOUS
  Filled 2024-07-23 (×13): qty 2

## 2024-07-23 MED ORDER — ACETAMINOPHEN 650 MG RE SUPP: 650.0000 mg | Freq: Four times a day (QID) | RECTAL | Status: DC | PRN

## 2024-07-23 MED ORDER — ACETAMINOPHEN 325 MG PO TABS: 650.0000 mg | ORAL_TABLET | Freq: Four times a day (QID) | ORAL | Status: DC | PRN

## 2024-07-23 MED ORDER — DICYCLOMINE HCL 20 MG PO TABS
20.0000 mg | ORAL_TABLET | Freq: Two times a day (BID) | ORAL | Status: DC
  Filled 2024-07-23 (×3): qty 1

## 2024-07-23 MED ORDER — HYDROMORPHONE HCL 1 MG/ML IJ SOLN: 0.5000 mg | INTRAMUSCULAR | Status: DC | PRN

## 2024-07-23 MED ORDER — IOHEXOL 300 MG/ML  SOLN
100.0000 mL | Freq: Once | INTRAMUSCULAR | Status: AC | PRN
  Administered 2024-07-23: 100 mL via INTRAVENOUS

## 2024-07-23 MED ORDER — VENLAFAXINE HCL ER 37.5 MG PO CP24: 150.0000 mg | ORAL_CAPSULE | Freq: Every day | ORAL | Status: DC

## 2024-07-23 MED ORDER — ALPRAZOLAM 0.5 MG PO TABS
0.5000 mg | ORAL_TABLET | Freq: Three times a day (TID) | ORAL | Status: DC | PRN
  Administered 2024-07-26 – 2024-07-28 (×6): 0.5 mg via ORAL
  Filled 2024-07-23 (×5): qty 1

## 2024-07-23 MED ORDER — TRAZODONE HCL 50 MG PO TABS
50.0000 mg | ORAL_TABLET | Freq: Every evening | ORAL | Status: DC | PRN
  Administered 2024-07-27: 50 mg via ORAL
  Filled 2024-07-23: qty 1

## 2024-07-23 MED ORDER — HYDRALAZINE HCL 20 MG/ML IJ SOLN
10.0000 mg | INTRAMUSCULAR | Status: DC | PRN
Start: 2024-07-23 — End: 2024-07-23

## 2024-07-23 MED ORDER — BISACODYL 5 MG PO TBEC: 5.0000 mg | DELAYED_RELEASE_TABLET | Freq: Every day | ORAL | Status: DC | PRN

## 2024-07-23 MED ORDER — FERROUS SULFATE 325 (65 FE) MG PO TABS
325.0000 mg | ORAL_TABLET | Freq: Every day | ORAL | Status: DC
  Administered 2024-07-23 – 2024-07-28 (×5): 325 mg via ORAL
  Filled 2024-07-23 (×5): qty 1

## 2024-07-23 MED ORDER — ONDANSETRON HCL 4 MG PO TABS
4.0000 mg | ORAL_TABLET | Freq: Four times a day (QID) | ORAL | Status: DC | PRN
  Administered 2024-07-24: 4 mg via ORAL
  Filled 2024-07-23: qty 1

## 2024-07-23 MED ORDER — FLEET ENEMA RE ENEM: 1.0000 | ENEMA | Freq: Once | RECTAL | Status: DC | PRN

## 2024-07-23 MED ORDER — ONDANSETRON HCL 4 MG/2ML IJ SOLN
4.0000 mg | Freq: Once | INTRAMUSCULAR | Status: AC
  Administered 2024-07-23: 4 mg via INTRAVENOUS
  Filled 2024-07-23: qty 2

## 2024-07-23 MED ORDER — DICYCLOMINE HCL 10 MG PO CAPS
20.0000 mg | ORAL_CAPSULE | Freq: Two times a day (BID) | ORAL | Status: DC
  Administered 2024-07-23 – 2024-07-28 (×10): 20 mg via ORAL
  Filled 2024-07-23 (×8): qty 2

## 2024-07-23 MED ORDER — ALUM & MAG HYDROXIDE-SIMETH 200-200-20 MG/5ML PO SUSP: 30.0000 mL | Freq: Four times a day (QID) | ORAL | Status: DC | PRN

## 2024-07-23 MED ORDER — FLUOXETINE HCL 20 MG PO CAPS
40.0000 mg | ORAL_CAPSULE | Freq: Every morning | ORAL | Status: DC
  Administered 2024-07-23 – 2024-07-28 (×6): 40 mg via ORAL
  Filled 2024-07-23 (×5): qty 2

## 2024-07-23 MED ORDER — ONDANSETRON HCL 4 MG/2ML IJ SOLN
4.0000 mg | Freq: Four times a day (QID) | INTRAMUSCULAR | Status: DC | PRN
Start: 2024-07-23 — End: 2024-07-28
  Administered 2024-07-23 – 2024-07-27 (×6): 4 mg via INTRAVENOUS
  Filled 2024-07-23 (×5): qty 2

## 2024-07-23 MED ORDER — SODIUM CHLORIDE 0.9 % IV SOLN: INTRAVENOUS | Status: DC

## 2024-07-23 MED ORDER — TRAZODONE HCL 50 MG PO TABS: 25.0000 mg | ORAL_TABLET | Freq: Every evening | ORAL | Status: DC | PRN

## 2024-07-23 MED ORDER — DEXTROSE-SODIUM CHLORIDE 5-0.45 % IV SOLN: INTRAVENOUS | Status: DC

## 2024-07-23 MED ORDER — SODIUM CHLORIDE 0.9% FLUSH
3.0000 mL | Freq: Two times a day (BID) | INTRAVENOUS | Status: DC
  Administered 2024-07-23 – 2024-07-24 (×2): 3 mL via INTRAVENOUS

## 2024-07-23 MED ORDER — IPRATROPIUM BROMIDE 0.02 % IN SOLN: 0.5000 mg | Freq: Four times a day (QID) | RESPIRATORY_TRACT | Status: DC | PRN

## 2024-07-23 NOTE — Assessment & Plan Note (Signed)
Continue as needed trazodone. 

## 2024-07-23 NOTE — ED Triage Notes (Signed)
 Pt c/o abdominal pain and N/V. Was seen here on 8/4 and 8/5 for same and dx with enteritis. Pt reports symptoms returned last night after eating dinner.

## 2024-07-23 NOTE — Assessment & Plan Note (Signed)
-   Etiology unclear -Acute, nonspecific findings possible enteritis ureteral thickening -Obtaining pelvic ultrasound -N.p.o. IVF half-normal saline, scheduled Reglan  -As needed Zofran  -As needed p.o./IV analgesics

## 2024-07-23 NOTE — Assessment & Plan Note (Signed)
 Send episodes not on any medications History of seizures in the context of withdrawal of benzodiazepine -Monitor closely -Continue recent use of benzodiazepines

## 2024-07-23 NOTE — ED Notes (Signed)
Pt refused finger stick at this time 

## 2024-07-23 NOTE — Assessment & Plan Note (Signed)
-   Home medication of Celexa -As needed Xanax

## 2024-07-23 NOTE — TOC CM/SW Note (Signed)
 Transition of Care [facility]) - Inpatient Brief Assessment   Patient Details  Name: Ashley Soto MRN: [id number] Date of Birth: [birth year]  [facility] of [facility]) CM/SW Contact:    Lucie Lunger, LCSWA Phone Number: 07/23/2024, 8:44 AM   Clinical Narrative: Transition of Care Department [facility]) has reviewed patient and no TOC needs have been identified at this time. We will continue to monitor patient advancement through interdiciplinary progression rounds. If new patient transition needs arise, please place a TOC consult.  Transition of Care Asessment: Insurance and Status: Insurance coverage has been reviewed Patient has primary care physician: Yes Home environment has been reviewed: From home Prior level of function:: Independent Prior/Current Home Services: No current home services Social Drivers of Health Review: SDOH reviewed no interventions necessary Readmission risk has been reviewed: Yes Transition of care needs: no transition of care needs at this time

## 2024-07-23 NOTE — Hospital Course (Signed)
 Ashley Soto is a 43 y.o. female with PMH anxiety, depression, PCOS, benzodiazepine withdrawal seizures who presents emerged part for evaluation of abdominal pain.  Patient was seen yesterday morning for similar complaints and diagnosed with enteritis with a negative CT and pelvic ultrasound.  States that symptoms worsened tonight and return to the emergency department for further evaluation.  Denies chest pain, shortness of breath, headache, fever or other systemic symptoms

## 2024-07-23 NOTE — Assessment & Plan Note (Signed)
 h/o status post detoxification for Xanax  abuse, May 2008, under the direction of Dr. Bernardino  - Denies any recent use or abuse colonic Xylac - Obtaining urine drug screen

## 2024-07-23 NOTE — ED Provider Notes (Signed)
 [place] EMERGENCY DEPARTMENT AT [facility] Provider Note   CSN: [id number] Arrival date & time: 07/23/24  9575     Patient presents with: Abdominal Pain   Ashley Soto is a 43 y.o. female.   The history is provided by the patient.  Abdominal Pain  She has history of polycystic ovarian syndrome and comes in with ongoing abdominal pain and vomiting.  She has been seen in the emergency department for the last 2 days with abdominal pain and vomiting and diagnosed with enteritis by CT scan.  She states that she continues to have severe abdominal pain which comes in waves and also has been unable to hold anything down.  She states she has not had a bowel movement or passed any flatus.  She denies fever, chills, sweats.    Prior to Admission medications   Medication Sig Start Date End Date Taking? Authorizing Provider  amoxicillin -clavulanate (AUGMENTIN ) 875-125 MG tablet Take 1 tablet by mouth 2 (two) times daily. 07/21/24   Veta Palma, PA-C  aspirin 81 MG chewable tablet Chew 81 mg by mouth daily.    [provider]  calcium-vitamin D 250-100 MG-UNIT tablet Take 1 tablet by mouth daily.    [provider]  dicyclomine  (BENTYL ) 20 MG tablet Take 1 tablet (20 mg total) by mouth 2 (two) times daily. 07/22/24   Kommor, Madison, MD  ferrous sulfate  325 (65 FE) MG tablet Take 325 mg by mouth daily with breakfast.    [provider]  FIRST-PROGESTERONE  VGS 200 MG SUPP PLACE 1 SUPPOSITORY (200 MG TOTAL) VAGINALLY AT BEDTIME. 10/04/21   Schuman, Christanna R, MD  ondansetron  (ZOFRAN -ODT) 4 MG disintegrating tablet Take 1 tablet (4 mg total) by mouth every 8 (eight) hours as needed for nausea or vomiting. 07/21/24   Veta Palma, PA-C  Prenatal Vit-Fe Fumarate-FA (PRENATAL MULTIVITAMIN) TABS tablet Take 1 tablet by mouth daily at 12 noon.    [provider]  traZODone  (DESYREL ) 50 MG tablet Take 50-100 mg by mouth at bedtime. 10/19/18   [provider]  venlafaxine  XR (EFFEXOR -XR) 75 MG 24 hr capsule Take 150 mg by mouth daily. Patient not taking: No sig reported 10/20/18   [provider]    Allergies: Paroxetine hcl and Sulfa antibiotics    Review of Systems  Gastrointestinal:  Positive for abdominal pain.  All other systems reviewed and are negative.   Updated Vital Signs BP 113/65   Pulse (!) 57   Temp 99.1 F (37.3 C)   Resp 19   SpO2 98%   Physical Exam Vitals and nursing note reviewed.   43 year old female, appears uncomfortable, but is in no acute distress. Vital signs are significant for slightly slow heart rate. Oxygen saturation is 98%, which is normal. Head is normocephalic and atraumatic. PERRLA, EOMI. Oropharynx is clear. Neck is nontender and supple without adenopathy Back is nontender and there is no CVA tenderness. Lungs are clear without rales, wheezes, or rhonchi. Chest is nontender. Heart has regular rate and rhythm without murmur. Abdomen is soft, flat, with moderate epigastric tenderness.  There is no rebound or guarding. Skin is warm and dry without rash. Neurologic: Mental status is normal, cranial nerves are intact, moves all extremities equally.  (all labs ordered are listed, but only abnormal results are displayed) Labs Reviewed  COMPREHENSIVE METABOLIC PANEL WITH GFR - Abnormal; Notable for the following components:      Result Value   Glucose, Bld 116 (*)  All other components within normal limits  CBC WITH DIFFERENTIAL/PLATELET - Abnormal; Notable for the following components:   WBC 11.8 (*)    Neutro Abs 8.5 (*)    All other components within normal limits  LIPASE, BLOOD  LACTIC ACID, PLASMA    Radiology: CT ABDOMEN PELVIS W CONTRAST Result Date: 07/23/2024 EXAM: CT ABDOMEN AND PELVIS WITH CONTRAST 07/23/2024 05:32:12 AM TECHNIQUE: CT of the abdomen and pelvis was performed with the administration of intravenous contrast. Multiplanar reformatted images are  provided for review. Automated exposure control, iterative reconstruction, and/or weight based adjustment of the mA/kV was utilized to reduce the radiation dose to as low as reasonably achievable. COMPARISON: CT abdomen and pelvis with contrast 07/21/2024. Pelvic ultrasound 07/21/2024. CLINICAL HISTORY: Abdominal pain, acute, nonlocalized. Pt c/o abdominal pain and N/V. Was seen here on 8/4 and 8/5 for same and dx with enteritis. Pt reports symptoms returned last night after eating dinner. FINDINGS: LOWER CHEST: No acute abnormality. LIVER: The liver is unremarkable. GALLBLADDER AND BILE DUCTS: Gallbladder is unremarkable. No biliary ductal dilatation. SPLEEN: No acute abnormality. PANCREAS: No acute abnormality. ADRENAL GLANDS: No acute abnormality. KIDNEYS, URETERS AND BLADDER: No stones in the kidneys or ureters. No hydronephrosis. No perinephric or periureteral stranding. Urinary bladder is unremarkable. GI AND BOWEL: Previously noted areas of small bowel wall thickening are no longer present. No significant inflammatory changes of the bowel are present. PERITONEUM AND RETROPERITONEUM: Minimal free fluid in the anatomic pelvis is likely physiologic. No free air. VASCULATURE: Aorta is normal in caliber. LYMPH NODES: No lymphadenopathy. REPRODUCTIVE ORGANS: Edematous changes of the uterus are again noted. Prominent follicles in both ovaries are noted. BONES AND SOFT TISSUES: No acute osseous abnormality. No focal soft tissue abnormality. IMPRESSION: 1. No acute findings in the abdomen or pelvis. 2. Edematous changes of the uterus and prominent follicles in both ovaries. Minimal free fluid in the anatomic pelvis, likely physiologic. Electronically signed by: Lonni Necessary MD 07/23/2024 05:57 AM EDT RP Workstation: [id number]     Procedures   Medications Ordered in the ED  sodium chloride  0.9 % bolus 1,000 mL (1,000 mLs Intravenous New Bag/Given 07/23/24 0510)  morphine  (PF) 4 MG/ML injection 4 mg (4 mg  Intravenous Given 07/23/24 0516)  ondansetron  (ZOFRAN ) injection 4 mg (4 mg Intravenous Given 07/23/24 0515)  iohexol  (OMNIPAQUE ) 300 MG/ML solution 100 mL (100 mLs Intravenous Contrast Given 07/23/24 0522)                                    Medical Decision Making Amount and/or Complexity of Data Reviewed Labs: ordered. Radiology: ordered.  Risk Prescription drug management. Decision regarding hospitalization.   Persistent abdominal pain and vomiting.  This is a presentation with a wide range of treatment options and carries with it a high risk of morbidity and complications.  Differential diagnosis includes, but is not limited to, enteritis, diverticulitis, appendicitis, cholecystitis, pancreatitis, bowel obstruction.  I have reviewed her past records, and do note ED visits yesterday and the day before for similar symptoms.  CT scan on 8/4 suggested a small area of enteritis.  This should have responded to treatment.  I am concerned that there is other significant pathology going on.  I have ordered a repeat CT scan.  Have also ordered IV fluids, morphine  for pain, ondansetron  for nausea, repeat laboratory testing.  Because of failure to manage this effectively as an outpatient, I anticipate need for  hospital admission.  She feels much better following above-noted treatment.  I reviewed her laboratory tests, and my interpretation is elevated random glucose level which will need to be followed as an outpatient and otherwise normal metabolic panel, mild leukocytosis with WBC downtrending and otherwise normal CBC, normal lipase, normal lactic acid level.  CT of abdomen and pelvis shows no acute findings, incidental finding of edematous changes of the uterus and prominent follicles in both ovaries and minimal free fluid in the anatomic pelvis.  I have independently viewed the images, and agree with the radiologist's interpretation.  Cause for her pain and nausea is not clear, but she has failed  outpatient management.  I have discussed case with Dr. Charlton of [facility], who agrees to admit the patient.     Final diagnoses:  Generalized abdominal pain  Nausea and vomiting, unspecified vomiting type  Elevated random blood glucose level    ED Discharge Orders     None          Raford Lenis, MD 07/23/24 [phone]

## 2024-07-23 NOTE — Progress Notes (Signed)
 Called x2 for report, nurse not available.

## 2024-07-23 NOTE — Consult Note (Signed)
 Gastroenterology Consult   Referring Provider: No ref. provider found Primary Care Physician:  Beverli Casa Primary Gastroenterologist:  not established   Patient ID: Ashley Soto; [id number]; 12-06-1981   Admit date: 07/23/2024  LOS: 0 days   Date of Consultation: 07/23/2024  Reason for Consultation:  abdominal pain   History of Present Illness   Ashley Soto is a 43 y.o. year old female with history of anxiety, depression, substance abuse, PCOS who presented to the ED with ongoing abdominal pain and vomiting. Seen in the ED on 8/4 and 8/5 for the same. CT initially concerning for enteritis. Repeat imaging this admission without acute findings in the GI tract. GI consulted for further evaluation  ED course: T max 99.1 -CT A/P with contrast: edematous changes of uterus/prominent follicles in both ovaries, minimal free fluid in anatomic pelvis, likely physiolgoci  -WBC 11.8, lipase 43, LA 1.5, sed rate 5 CRP 0.5 -UDS positive for amphetamines and opiates ( on adderall)  Consult: Sudden onset of mid/lower abdominal pain on Sunday after eating some ice cream. States se tougt pain would stop but it worsened. No BM since Sunday. Se began aving vomiting later tat night as te pain became so bad. Se denies nausea and feels taat vomiting is secondary to wen pain becomes severe. Dull in nature, denies sarp pain or radiation of pain. Denies rectal bleeding or melena. No ematesis or coffee ground emesis. Reports tat pain waxes and wains but is constant once it begins. After discarge a few days ago se felt better but notes upon eating se began aving more abdominal pain again. Se took tylenol , nausea meds, dicyclomine  witout improvement. Se denies any canges in appetite or weigt loss. Denies any recent new meds. Endorses ciills alternating wit sweating.   No eto Smokes about 5-6 cigarettes per day No CRC, liver disease, pancreatic cancers in immediate relatives  Last EGD: never  Last Colonoscopy:  never    Past Medical History:  Diagnosis Date   Anxiety and depression    History of seizures     D/T  BENZODIAZEPINE WITHDRAWAL. only had one seizure   History of substance abuse (HCC)    ALCOHOL AND DRUG FREE FOR 4 YEARS.  INVOLVED WITH AA   Polycystic disease, ovaries    Tinea corporis    Tobacco abuse     Past Surgical History:  Procedure Laterality Date   DILATION AND CURETTAGE OF UTERUS     DILATION AND CURETTAGE OF UTERUS N/A 03/19/2016   Procedure: SUCTION DILATATION AND CURETTAGE;  Surgeon: Bebe Furry, MD;  Location: [facility] ORS;  Service: Gynecology;  Laterality: N/A;   DORSAL COMPARTMENT RELEASE Left 12/28/2020   Procedure: Everitt Curt release, left;  Surgeon: Kathlynn Sharper, MD;  Location: [facility] ORS;  Service: Orthopedics;  Laterality: Left;   TONSILLECTOMY      Prior to Admission medications   Medication Sig Start Date End Date Taking? Authorizing Provider  amoxicillin -clavulanate (AUGMENTIN ) 875-125 MG tablet Take 1 tablet by mouth 2 (two) times daily. 07/21/24  Yes Veta Palma, PA-C  amphetamine-dextroamphetamine (ADDERALL) 20 MG tablet Take 20 mg by mouth 2 (two) times daily. 07/08/24  Yes [provider]  Azelaic Acid 15 % gel Apply 1 Application topically daily. 06/30/24  Yes [provider]  busPIRone  (BUSPAR ) 15 MG tablet Take 15 mg by mouth 2 (two) times daily. 07/01/24  Yes [provider]  clindamycin (CLEOCIN T) 1 % lotion Apply 1 Application topically daily. 03/10/24  Yes [provider]  diclofenac (VOLTAREN) 75 MG EC tablet Take 75 mg by mouth 2 (two) times daily. 7 days supply 07/18/24 07/25/24 Yes [provider]  dicyclomine  (BENTYL ) 20 MG tablet Take 1 tablet (20 mg total) by mouth 2 (two) times daily. 07/22/24  Yes Kommor, Madison, MD  etonogestrel  (NEXPLANON ) 68 MG IMPL implant 68 mg by Subdermal route once. 12/22/21  Yes [provider]  FLUoxetine  (PROZAC ) 40 MG capsule Take 40 mg by mouth every  morning.   Yes [provider]  ondansetron  (ZOFRAN -ODT) 4 MG disintegrating tablet Take 1 tablet (4 mg total) by mouth every 8 (eight) hours as needed for nausea or vomiting. 07/21/24  Yes Veta Palma, PA-C  spironolactone (ALDACTONE) 50 MG tablet Take 150 mg by mouth daily. 06/30/24  Yes [provider]  traZODone  (DESYREL ) 50 MG tablet Take 50-100 mg by mouth at bedtime. 10/19/18  Yes [provider]  tretinoin (RETIN-A) 0.025 % cream Apply topically. 03/13/24  Yes [provider]    Current Facility-Administered Medications  Medication Dose Route Frequency Provider Last Rate Last Admin   acetaminophen  (TYLENOL ) tablet 650 mg  650 mg Oral Q6H PRN Shahmehdi, Seyed A, MD       Or   acetaminophen  (TYLENOL ) suppository 650 mg  650 mg Rectal Q6H PRN Shahmehdi, Seyed A, MD       ALPRAZolam  (XANAX ) tablet 0.5 mg  0.5 mg Oral TID PRN Shahmehdi, Seyed A, MD       alum & mag hydroxide-simeth (MAALOX/MYLANTA) 200-200-20 MG/5ML suspension 30 mL  30 mL Oral Q6H PRN Shahmehdi, Seyed A, MD       bisacodyl  (DULCOLAX) EC tablet 5 mg  5 mg Oral Daily PRN Shahmehdi, Seyed A, MD       busPIRone  (BUSPAR ) tablet 15 mg  15 mg Oral BID Shahmehdi, Seyed A, MD   15 mg at 07/23/24 1437   dextrose  5 % and 0.45 % NaCl infusion   Intravenous Continuous Shahmehdi, Seyed A, MD 100 mL/hr at 07/23/24 1102 New Bag at 07/23/24 1102   dicyclomine  (BENTYL ) capsule 20 mg  20 mg Oral BID Opyd, Timothy S, MD   20 mg at 07/23/24 9066   ferrous sulfate  tablet 325 mg  325 mg Oral Q breakfast Shahmehdi, Seyed A, MD   325 mg at 07/23/24 9065   FLUoxetine  (PROZAC ) capsule 40 mg  40 mg Oral q morning Shahmehdi, Seyed A, MD   40 mg at 07/23/24 1437   heparin  injection 5,000 Units  5,000 Units Subcutaneous Q8H Shahmehdi, Seyed A, MD       HYDROmorphone  (DILAUDID ) injection 0.5 mg  0.5 mg Intravenous Q2H PRN Shahmehdi, Seyed A, MD   0.5 mg at 07/23/24 1211   ipratropium (ATROVENT ) nebulizer solution 0.5  mg  0.5 mg Nebulization Q6H PRN Shahmehdi, Seyed A, MD       levalbuterol  (XOPENEX ) nebulizer solution 0.63 mg  0.63 mg Nebulization Q6H PRN Shahmehdi, Seyed A, MD       metoCLOPramide  (REGLAN ) injection 10 mg  10 mg Intravenous Q8H Shahmehdi, Seyed A, MD   10 mg at 07/23/24 1438   ondansetron  (ZOFRAN ) tablet 4 mg  4 mg Oral Q6H PRN Shahmehdi, Seyed A, MD       Or   ondansetron  (ZOFRAN ) injection 4 mg  4 mg Intravenous Q6H PRN Shahmehdi, Seyed A, MD   4 mg at 07/23/24 1214   oxyCODONE  (Oxy IR/ROXICODONE ) immediate release tablet 5 mg  5 mg Oral Q4H PRN Willette Adriana LABOR, MD  pantoprazole  (PROTONIX ) EC tablet 40 mg  40 mg Oral BID Willette Jest A, MD   40 mg at 07/23/24 0933   senna-docusate (Senokot-S) tablet 1 tablet  1 tablet Oral QHS PRN Shahmehdi, Jest A, MD       sodium chloride  flush (NS) 0.9 % injection 3 mL  3 mL Intravenous Q12H Shahmehdi, Seyed A, MD   3 mL at 07/23/24 1102   sodium chloride  flush (NS) 0.9 % injection 3 mL  3 mL Intravenous Q12H Shahmehdi, Seyed A, MD   3 mL at 07/23/24 1102   sodium phosphate  (FLEET) enema 1 enema  1 enema Rectal Once PRN Shahmehdi, Seyed A, MD       traZODone  (DESYREL ) tablet 50 mg  50 mg Oral QHS PRN Willette Jest LABOR, MD        Allergies as of 07/23/2024 - Review Complete 07/23/2024  Allergen Reaction Noted   Paroxetine hcl  03/18/2016   Sulfa antibiotics Hives 03/18/2016    Family History  Problem Relation Age of Onset   Breast cancer Mother        early 66s   Breast cancer Paternal Grandmother        early 12s    Social History   Socioeconomic History   Marital status: Married    Spouse name: Ozell   Number of children: 0   Years of education: Not on file   Highest education level: Not on file  Occupational History   Occupation: Environmental education officer  Tobacco Use   Smoking status: Every Day    Current packs/day: 0.50    Types: Cigarettes   Smokeless tobacco: Never  Vaping Use   Vaping status: Never Used   Substance and Sexual Activity   Alcohol use: Not Currently    Comment: ALCOHOL FREE X 4 YEARS   Drug use: No    Comment: DRUG FREE X 4 YEARS   Sexual activity: Yes  Other Topics Concern   Not on file  Social History Narrative   Patient lives with husband. Patient feels safe in her home.   Social Drivers of Corporate investment banker Strain: Low Risk  (02/29/2024)   Received from [facility]   Overall Financial Resource Strain (CARDIA)    Difficulty of Paying Living Expenses: Not hard at all  Food Insecurity: No Food Insecurity (02/29/2024)   Received from [facility]   Hunger Vital Sign    Within the past 12 months, you worried that your food would run out before you got the money to buy more.: Never true    Within the past 12 months, the food you bought just didn't last and you didn't have money to get more.: Never true  Transportation Needs: No Transportation Needs (02/29/2024)   Received from [facility] - Transportation    In the past 12 months, has lack of transportation kept you from medical appointments or from getting medications?: No    Lack of Transportation (Non-Medical): No  Physical Activity: Not on file  Stress: Not on file  Social Connections: Not on file  Intimate Partner Violence: Not on file     Review of Systems   Gen: Denies any fever, chills, loss of appetite, change in weight or weight loss CV: Denies chest pain, heart palpitations, syncope, edema  Resp: Denies shortness of breath with rest, cough, wheezing, coughing up blood, and pleurisy. GI: +lower abdominal pain +constipation +vomiting with severe pain GU :  Denies urinary burning, blood in urine, urinary frequency, and urinary incontinence. MS: Denies joint pain, limitation of movement, swelling, cramps, and atrophy.  Derm: Denies rash, itching, dry skin, hives. Psych: Denies depression, anxiety, memory loss, hallucinations, and  confusion. Heme: Denies bruising or bleeding Neuro:  Denies any headaches, dizziness, paresthesias, shaking  Physical Exam   Vital Signs in last 24 hours: Temp:  [98.9 F (37.2 C)-99.1 F (37.3 C)] 99.1 F (37.3 C) (08/06 1420) Pulse Rate:  [49-72] 49 (08/06 1420) Resp:  [14-19] 16 (08/06 1330) BP: (84-140)/(45-81) 84/57 (08/06 1420) SpO2:  [96 %-100 %] 100 % (08/06 1330) Weight:  [59 kg-60.4 kg] 60.4 kg (08/06 1420)    General:   Alert,  Well-developed, well-nourished, pleasant and cooperative in NAD Head:  Normocephalic and atraumatic. Eyes:  Sclera clear, no icterus.   Conjunctiva pink. Ears:  Normal auditory acuity. Mouth:  No deformity or lesions, dentition normal. Neck:  Supple; no masses Lungs:  Clear throughout to auscultation.   No wheezes, crackles, or rhonchi. No acute distress. Heart:  Regular rate and rhythm; no murmurs, clicks, rubs,  or gallops. Abdomen:  Soft, nontender and nondistended. No masses, hepatosplenomegaly or hernias noted. Normal bowel sounds, without guarding, and without rebound.   Msk:  Symmetrical without gross deformities. Normal posture. Extremities:  Without clubbing or edema. Neurologic:  Alert and  oriented x4. Skin:  Intact without significant lesions or rashes. Psych:  Alert and cooperative. Normal mood and affect.   Labs/Studies   Recent Labs Recent Labs    07/21/24 0915 07/22/24 0221 07/23/24 0511  WBC 17.3* 14.2* 11.8*  HGB 14.9 15.3* 14.6  HCT 41.9 44.0 42.2  PLT 339 331 333   BMET Recent Labs    07/21/24 0915 07/22/24 0221 07/23/24 0511  NA 134* 136 137  K 3.8 3.9 4.2  CL 101 101 101  CO2 21* 23 25  GLUCOSE 134* 113* 116*  BUN 10 15 12   CREATININE 0.56 0.70 0.69  CALCIUM 9.4 9.4 9.0   LFT Recent Labs    07/21/24 0915 07/22/24 0221 07/23/24 0511  PROT 7.6 6.9 7.0  ALBUMIN 4.1 3.7 3.8  AST 26 21 17   ALT 19 18 17   ALKPHOS 58 54 51  BILITOT 0.9 1.0 1.2   Radiology/Studies CT ABDOMEN PELVIS W  CONTRAST Result Date: 07/23/2024 EXAM: CT ABDOMEN AND PELVIS WITH CONTRAST 07/23/2024 05:32:12 AM TECHNIQUE: CT of the abdomen and pelvis was performed with the administration of intravenous contrast. Multiplanar reformatted images are provided for review. Automated exposure control, iterative reconstruction, and/or weight based adjustment of the mA/kV was utilized to reduce the radiation dose to as low as reasonably achievable. COMPARISON: CT abdomen and pelvis with contrast 07/21/2024. Pelvic ultrasound 07/21/2024. CLINICAL HISTORY: Abdominal pain, acute, nonlocalized. Pt c/o abdominal pain and N/V. Was seen here on 8/4 and 8/5 for same and dx with enteritis. Pt reports symptoms returned last night after eating dinner. FINDINGS: LOWER CHEST: No acute abnormality. LIVER: The liver is unremarkable. GALLBLADDER AND BILE DUCTS: Gallbladder is unremarkable. No biliary ductal dilatation. SPLEEN: No acute abnormality. PANCREAS: No acute abnormality. ADRENAL GLANDS: No acute abnormality. KIDNEYS, URETERS AND BLADDER: No stones in the kidneys or ureters. No hydronephrosis. No perinephric or periureteral stranding. Urinary bladder is unremarkable. GI AND BOWEL: Previously noted areas of small bowel wall thickening are no longer present. No significant inflammatory changes of the bowel are present. PERITONEUM AND RETROPERITONEUM: Minimal free fluid in the anatomic pelvis is likely physiologic. No free air. VASCULATURE:  Aorta is normal in caliber. LYMPH NODES: No lymphadenopathy. REPRODUCTIVE ORGANS: Edematous changes of the uterus are again noted. Prominent follicles in both ovaries are noted. BONES AND SOFT TISSUES: No acute osseous abnormality. No focal soft tissue abnormality. IMPRESSION: 1. No acute findings in the abdomen or pelvis. 2. Edematous changes of the uterus and prominent follicles in both ovaries. Minimal free fluid in the anatomic pelvis, likely physiologic. Electronically signed by: Lonni Necessary MD  07/23/2024 05:57 AM EDT RP Workstation: [id number]    Assessment   Ashley Soto is a 43 y.o. year old female with history significant for PCOS who presented to the ED with ongoing abdominal pain, vomiting, previously seen in the ED on 8/4 and 8/5 with CT on 8/4 concerning for enteritis, repeat CT today without acute findings in the GI tract, GI consulted for further evaluation  Abdominal pain, nausea, vomiting:  -Onset sudden on Sunday  -CT on 8/4 concerning for enteritis though non specific  -CT this morning without acute GI findings but edematous uterus/prominent folicles in both ovaries -Pain intermittent, seems to be worse at times after eating -labs all reassuring -no rectal bleeding, melena, weight loss, changes in bowel habits, diarrhea or appetite changes  Discussion held with Dr. Shaaron, Colonoscopy likely to of low yield at this point, would recommend GYN evaluation given CT findings, consider Colonoscopy if no GYN etiology and symptoms persist.   Plan / Recommendations   -Clear liquids -anti emetics PRN -recommend GYN evaluation -consider colonoscopy if no GYn etiology and symptoms persist   07/23/2024, 2:51 PM  Leeandre Nordling L. Pama Roskos, MSN, APRN, AGNP-C Adult-Gerontology Nurse Practitioner [facility]

## 2024-07-23 NOTE — ED Notes (Signed)
 Provider notified of pt's BP at this time. Pt states she feels fine.

## 2024-07-23 NOTE — H&P (Signed)
 History and Physical   Patient: Ashley Soto                            PCP: Beverli Casa                    DOB: 1980/12/27            DOA: 07/23/2024 FMW:[id number]             DOS: 07/23/2024, 7:28 AM  Beverli, [name]  Patient coming from:   HOME  I have personally reviewed patient's medical records, in electronic medical records, including:  [facility], and care everywhere.    Chief Complaint:   Chief Complaint  Patient presents with   Abdominal Pain    History of present illness:    Ashley Soto is a 43 y.o. female with PMH anxiety, depression, PCOS, benzodiazepine withdrawal seizures who presents emerged part for evaluation of abdominal pain.  Patient was seen yesterday morning for similar complaints and diagnosed with enteritis with a negative CT and pelvic ultrasound.  States that symptoms worsened tonight and return to the emergency department for further evaluation.  Denies chest pain, shortness of breath, headache, fever or other systemic symptoms     Patient Denies having: Fever, Chills, Cough, SOB, Chest Pain, Abd pain, N/V/D, headache, dizziness, lightheadedness,  Dysuria, Joint pain, rash, open wounds     Review of Systems: As per HPI, otherwise 10 point review of systems were negative.   ----------------------------------------------------------------------------------------------------------------------  Allergies  Allergen Reactions   Paroxetine Hcl     Suicidal thoughts    Sulfa Antibiotics Hives    Home MEDs:  Prior to Admission medications   Medication Sig Start Date End Date Taking? Authorizing Provider  amoxicillin -clavulanate (AUGMENTIN ) 875-125 MG tablet Take 1 tablet by mouth 2 (two) times daily. 07/21/24   Veta Palma, PA-C  aspirin 81 MG chewable tablet Chew 81 mg by mouth daily.    [provider]  calcium-vitamin D 250-100 MG-UNIT tablet Take 1 tablet by mouth daily.    [provider]  dicyclomine  (BENTYL ) 20 MG  tablet Take 1 tablet (20 mg total) by mouth 2 (two) times daily. 07/22/24   Kommor, Lum, MD  ferrous sulfate  325 (65 FE) MG tablet Take 325 mg by mouth daily with breakfast.    [provider]  FIRST-PROGESTERONE  VGS 200 MG SUPP PLACE 1 SUPPOSITORY (200 MG TOTAL) VAGINALLY AT BEDTIME. 10/04/21   Schuman, Christanna R, MD  ondansetron  (ZOFRAN -ODT) 4 MG disintegrating tablet Take 1 tablet (4 mg total) by mouth every 8 (eight) hours as needed for nausea or vomiting. 07/21/24   Veta Palma, PA-C  Prenatal Vit-Fe Fumarate-FA (PRENATAL MULTIVITAMIN) TABS tablet Take 1 tablet by mouth daily at 12 noon.    [provider]  traZODone  (DESYREL ) 50 MG tablet Take 50-100 mg by mouth at bedtime. 10/19/18   [provider]  venlafaxine  XR (EFFEXOR -XR) 75 MG 24 hr capsule Take 150 mg by mouth daily. Patient not taking: No sig reported 10/20/18   [provider]    PRN MEDs: acetaminophen  **OR** acetaminophen , bisacodyl , hydrALAZINE , HYDROmorphone  (DILAUDID ) injection, ipratropium, levalbuterol , ondansetron  **OR** ondansetron  (ZOFRAN ) IV, oxyCODONE , senna-docusate, sodium phosphate , traZODone   Past Medical History:  Diagnosis Date   Anxiety and depression    History of seizures     D/T  BENZODIAZEPINE WITHDRAWAL. only had one seizure   History of substance abuse (HCC)    ALCOHOL AND  DRUG FREE FOR 4 YEARS.  INVOLVED WITH AA   Polycystic disease, ovaries    Tinea corporis    Tobacco abuse     Past Surgical History:  Procedure Laterality Date   DILATION AND CURETTAGE OF UTERUS     DILATION AND CURETTAGE OF UTERUS N/A 03/19/2016   Procedure: SUCTION DILATATION AND CURETTAGE;  Surgeon: Bebe Furry, MD;  Location: [facility] ORS;  Service: Gynecology;  Laterality: N/A;   DORSAL COMPARTMENT RELEASE Left 12/28/2020   Procedure: Everitt Curt release, left;  Surgeon: Kathlynn Sharper, MD;  Location: [facility] ORS;  Service: Orthopedics;  Laterality: Left;   TONSILLECTOMY        reports that she has been smoking cigarettes. She has never used smokeless tobacco. She reports that she does not currently use alcohol. She reports that she does not use drugs.   Family History  Problem Relation Age of Onset   Breast cancer Mother        early 54s   Breast cancer Paternal Grandmother        early 3s    Physical Exam:   Vitals:   07/23/24 0438 07/23/24 0500 07/23/24 0520 07/23/24 0530  BP: 113/65 115/67  (!) 140/81  Pulse: (!) 57 (!) 52 (!) 57 (!) 58  Resp: 19   18  Temp: 99.1 F (37.3 C)     TempSrc:      SpO2: 98% 96% 100% 100%   Constitutional: NAD, calm, comfortable Eyes: PERRL, lids and conjunctivae normal ENMT: Mucous membranes are moist. Posterior pharynx clear of any exudate or lesions.Normal dentition.  Neck: normal, supple, no masses, no thyromegaly Respiratory: clear to auscultation bilaterally, no wheezing, no crackles. Normal respiratory effort. No accessory muscle use.  Cardiovascular: Regular rate and rhythm, no murmurs / rubs / gallops. No extremity edema. 2+ pedal pulses. No carotid bruits.  Abdomen: no tenderness, no masses palpated. No hepatosplenomegaly. Bowel sounds positive.  Musculoskeletal: no clubbing / cyanosis. No joint deformity upper and lower extremities. Good ROM, no contractures. Normal muscle tone.  Neurologic: CN II-XII grossly intact. Sensation intact, DTR normal. Strength 5/5 in all 4.  Psychiatric: Normal judgment and insight. Alert and oriented x 3. Normal mood.  Skin: no rashes, lesions, ulcers. No induration Decubitus/ulcers:  Wounds: per nursing documentation     Labs on admission:    I have personally reviewed following labs and imaging studies  CBC: Recent Labs  Lab 07/21/24 0915 07/22/24 0221 07/23/24 0511  WBC 17.3* 14.2* 11.8*  NEUTROABS 14.4* 10.1* 8.5*  HGB 14.9 15.3* 14.6  HCT 41.9 44.0 42.2  MCV 88.0 88.9 89.6  PLT 339 331 333   Basic Metabolic Panel: Recent Labs  Lab 07/21/24 0915  07/22/24 0221 07/23/24 0511  NA 134* 136 137  K 3.8 3.9 4.2  CL 101 101 101  CO2 21* 23 25  GLUCOSE 134* 113* 116*  BUN 10 15 12   CREATININE 0.56 0.70 0.69  CALCIUM 9.4 9.4 9.0   GFR: Estimated Creatinine Clearance: 82.4 mL/min (by C-G formula based on SCr of 0.69 mg/dL). Liver Function Tests: Recent Labs  Lab 07/21/24 0915 07/22/24 0221 07/23/24 0511  AST 26 21 17   ALT 19 18 17   ALKPHOS 58 54 51  BILITOT 0.9 1.0 1.2  PROT 7.6 6.9 7.0  ALBUMIN 4.1 3.7 3.8   Recent Labs  Lab 07/21/24 0915 07/23/24 0511  LIPASE 33 43   Urine analysis:    Component Value Date/Time   COLORURINE YELLOW 07/21/2024 0945   APPEARANCEUR  CLEAR 07/21/2024 0945   APPEARANCEUR Clear 01/22/2015 1419   LABSPEC 1.024 07/21/2024 0945   LABSPEC 1.019 01/22/2015 1419   PHURINE 8.0 07/21/2024 0945   GLUCOSEU NEGATIVE 07/21/2024 0945   GLUCOSEU 50 mg/dL 97/94/7983 8580   HGBUR NEGATIVE 07/21/2024 0945   BILIRUBINUR NEGATIVE 07/21/2024 0945   BILIRUBINUR Negative 01/22/2015 1419   KETONESUR 20 (A) 07/21/2024 0945   PROTEINUR 30 (A) 07/21/2024 0945   NITRITE NEGATIVE 07/21/2024 0945   LEUKOCYTESUR NEGATIVE 07/21/2024 0945   LEUKOCYTESUR Negative 01/22/2015 1419    Last A1C:  Lab Results  Component Value Date   HGBA1C 5.3 07/01/2021     Radiologic Exams on Admission:   CT ABDOMEN PELVIS W CONTRAST Result Date: 07/23/2024 EXAM: CT ABDOMEN AND PELVIS WITH CONTRAST 07/23/2024 05:32:12 AM TECHNIQUE: CT of the abdomen and pelvis was performed with the administration of intravenous contrast. Multiplanar reformatted images are provided for review. Automated exposure control, iterative reconstruction, and/or weight based adjustment of the mA/kV was utilized to reduce the radiation dose to as low as reasonably achievable. COMPARISON: CT abdomen and pelvis with contrast 07/21/2024. Pelvic ultrasound 07/21/2024. CLINICAL HISTORY: Abdominal pain, acute, nonlocalized. Pt c/o abdominal pain and N/V. Was seen  here on 8/4 and 8/5 for same and dx with enteritis. Pt reports symptoms returned last night after eating dinner. FINDINGS: LOWER CHEST: No acute abnormality. LIVER: The liver is unremarkable. GALLBLADDER AND BILE DUCTS: Gallbladder is unremarkable. No biliary ductal dilatation. SPLEEN: No acute abnormality. PANCREAS: No acute abnormality. ADRENAL GLANDS: No acute abnormality. KIDNEYS, URETERS AND BLADDER: No stones in the kidneys or ureters. No hydronephrosis. No perinephric or periureteral stranding. Urinary bladder is unremarkable. GI AND BOWEL: Previously noted areas of small bowel wall thickening are no longer present. No significant inflammatory changes of the bowel are present. PERITONEUM AND RETROPERITONEUM: Minimal free fluid in the anatomic pelvis is likely physiologic. No free air. VASCULATURE: Aorta is normal in caliber. LYMPH NODES: No lymphadenopathy. REPRODUCTIVE ORGANS: Edematous changes of the uterus are again noted. Prominent follicles in both ovaries are noted. BONES AND SOFT TISSUES: No acute osseous abnormality. No focal soft tissue abnormality. IMPRESSION: 1. No acute findings in the abdomen or pelvis. 2. Edematous changes of the uterus and prominent follicles in both ovaries. Minimal free fluid in the anatomic pelvis, likely physiologic. Electronically signed by: Lonni Necessary MD 07/23/2024 05:57 AM EDT RP Workstation: [id number]   US  Pelvis Complete Result Date: 07/21/2024 CLINICAL DATA:  eval endometrial thickening on CT. EXAM: TRANSABDOMINAL AND TRANSVAGINAL ULTRASOUND OF PELVIS TECHNIQUE: Both transabdominal and transvaginal ultrasound examinations of the pelvis were performed. Transabdominal technique was performed for global imaging of the pelvis including uterus, ovaries, adnexal regions, and pelvic cul-de-sac. It was necessary to proceed with endovaginal exam following the transabdominal exam to visualize the uterus and endometrium. COMPARISON:  CT scan abdomen and pelvis from  earlier the same day. FINDINGS: Uterus Measurements: 4.3 x 4.3 x 7.8 cm = volume: 74.3 mL. No fibroids or other mass visualized. Endometrium Thickness: 6.6 mm.  No focal abnormality visualized. Right ovary Measurements: 2.3 x 2.8 x 3.3 cm = volume: 11.1 mL. Normal appearance/no adnexal mass. Left ovary Measurements: 1.9 x 2.2 x 3.6 cm = volume: 8.1 mL. Normal appearance/no adnexal mass. Other findings No abnormal free fluid. IMPRESSION: *Unremarkable pelvic ultrasound exam. Electronically Signed   By: Ree Molt M.D.   On: 07/21/2024 12:36   US  Transvaginal Non-OB Result Date: 07/21/2024 CLINICAL DATA:  eval endometrial thickening on CT. EXAM: TRANSABDOMINAL AND TRANSVAGINAL  ULTRASOUND OF PELVIS TECHNIQUE: Both transabdominal and transvaginal ultrasound examinations of the pelvis were performed. Transabdominal technique was performed for global imaging of the pelvis including uterus, ovaries, adnexal regions, and pelvic cul-de-sac. It was necessary to proceed with endovaginal exam following the transabdominal exam to visualize the uterus and endometrium. COMPARISON:  CT scan abdomen and pelvis from earlier the same day. FINDINGS: Uterus Measurements: 4.3 x 4.3 x 7.8 cm = volume: 74.3 mL. No fibroids or other mass visualized. Endometrium Thickness: 6.6 mm.  No focal abnormality visualized. Right ovary Measurements: 2.3 x 2.8 x 3.3 cm = volume: 11.1 mL. Normal appearance/no adnexal mass. Left ovary Measurements: 1.9 x 2.2 x 3.6 cm = volume: 8.1 mL. Normal appearance/no adnexal mass. Other findings No abnormal free fluid. IMPRESSION: *Unremarkable pelvic ultrasound exam. Electronically Signed   By: Ree Molt M.D.   On: 07/21/2024 12:36   CT ABDOMEN PELVIS W CONTRAST Result Date: 07/21/2024 CLINICAL DATA:  Abdominal pain. EXAM: CT ABDOMEN AND PELVIS WITH CONTRAST TECHNIQUE: Multidetector CT imaging of the abdomen and pelvis was performed using the standard protocol following bolus administration of  intravenous contrast. RADIATION DOSE REDUCTION: This exam was performed according to the departmental dose-optimization program which includes automated exposure control, adjustment of the mA and/or kV according to patient size and/or use of iterative reconstruction technique. CONTRAST:  100mL OMNIPAQUE  IOHEXOL  300 MG/ML  SOLN COMPARISON:  None Available. FINDINGS: Lower chest: Heart is normal size.  Lung bases are clear. Hepatobiliary: Liver, gallbladder and biliary tree are normal. Pancreas: Normal. Spleen: Normal. Adrenals/Urinary Tract: Adrenal glands are normal. Kidneys are normal in size without hydronephrosis or nephrolithiasis. Ureters and bladder are normal. Stomach/Bowel: Stomach is normal. Small bowel is normal in caliber and demonstrates minimal wall thickening over several jejunal loops in the left abdomen which is nonspecific. No adjacent free fluid or inflammatory change. Appendix is normal. Colon is normal. Vascular/Lymphatic: Abdominal aorta is normal in caliber. Remaining vascular structures are unremarkable. No significant adenopathy. Reproductive: Uterus normal size with mild prominence of the endometrium which is not well evaluated by CT. Ovaries are unremarkable. Other: No significant free fluid or focal inflammatory change. Musculoskeletal: No focal abnormality. IMPRESSION: 1. Minimal wall thickening over several jejunal loops in the left abdomen which is nonspecific and may be due to mild enteritis of infectious or inflammatory nature. No adjacent free fluid or inflammatory change. 2. Mild prominence of the endometrium which is not well evaluated by CT. Pelvic ultrasound would be helpful for further evaluation. Electronically Signed   By: Toribio Agreste M.D.   On: 07/21/2024 10:45    EKG:   Independently reviewed.  Orders placed or performed during the hospital encounter of 07/23/24   EKG 12-Lead    ---------------------------------------------------------------------------------------------------------------------------------------    Assessment / Plan:   Principal Problem:   Intractable abdominal pain Active Problems:   Anxiety state   History of substance abuse (HCC)   Primary insomnia   History of seizure   Assessment and Plan: * Intractable abdominal pain - Etiology unclear -Acute, nonspecific findings possible enteritis ureteral thickening -Obtaining pelvic ultrasound -N.p.o. IVF half-normal saline, scheduled Reglan  -As needed Zofran  -As needed p.o./IV analgesics  Anxiety state - Home medication of Celexa -As needed Xanax   History of substance abuse (HCC) h/o status post detoxification for Xanax  abuse, May 2008, under the direction of Dr. Bernardino  - Denies any recent use or abuse colonic Xylac - Obtaining urine drug screen  Primary insomnia Continue as needed trazodone   History of seizure Send  episodes not on any medications History of seizures in the context of withdrawal of benzodiazepine -Monitor closely -Continue recent use of benzodiazepines        Consults called:  None -------------------------------------------------------------------------------------------------------------------------------------------- DVT prophylaxis:  heparin  injection 5,000 Units Start: 07/23/24 0730 SCDs Start: 07/23/24 0715   Code Status:   Code Status: Full Code   Admission status: Patient will be admitted as Observation, with a greater than 2 midnight length of stay. Level of care: Med-Surg   Family Communication:  none at bedside  (The above findings and plan of care has been discussed with patient in detail, the patient expressed understanding and agreement of above plan)  --------------------------------------------------------------------------------------------------------------------------------------------  Disposition Plan:  Anticipated 1-2  days Status is: Observation The patient remains OBS appropriate and will d/c before 2 midnights.   -----------------------------------------------------------------------------------------------------------------------------------  Time spent:  79  Min.  Was spent seeing and evaluating the patient, reviewing all medical records, drawn plan of care.  SIGNED: Adriana DELENA Grams, MD, FHM. FAAFP. [place] [facility], Pager  (Please use [email or web address] to page/ or secure chat through epic) If 7PM-7AM, please contact night-coverage [email or web address],  07/23/2024, 7:28 AM

## 2024-07-23 NOTE — Progress Notes (Signed)
 Pt arrived to room 332 via stretcher from ED. Pt able to stand and pivot transfer from stretcher to bed without assistance. Pt A&O, IVF bolus still infusing from ED. Pt and S.O. oriented to room and safety procedures, state understanding. Pt only c/o abd pain in bilateral lower abd, worse with palpation. States pain controlled at this time, denies any nausea. Asking when she might be able to eat or drink. Advised pt that we would contact MD for update on diet. States understanding.  Bed in low position, call bell within reach.

## 2024-07-23 NOTE — ED Notes (Signed)
 Provider at bedside

## 2024-07-24 DIAGNOSIS — R109 Unspecified abdominal pain: Secondary | ICD-10-CM | POA: Diagnosis not present

## 2024-07-24 DIAGNOSIS — I959 Hypotension, unspecified: Secondary | ICD-10-CM | POA: Diagnosis not present

## 2024-07-24 DIAGNOSIS — Z7982 Long term (current) use of aspirin: Secondary | ICD-10-CM | POA: Diagnosis not present

## 2024-07-24 DIAGNOSIS — R1084 Generalized abdominal pain: Principal | ICD-10-CM

## 2024-07-24 DIAGNOSIS — Z888 Allergy status to other drugs, medicaments and biological substances status: Secondary | ICD-10-CM | POA: Diagnosis not present

## 2024-07-24 DIAGNOSIS — R112 Nausea with vomiting, unspecified: Secondary | ICD-10-CM | POA: Diagnosis not present

## 2024-07-24 DIAGNOSIS — K529 Noninfective gastroenteritis and colitis, unspecified: Secondary | ICD-10-CM | POA: Diagnosis present

## 2024-07-24 DIAGNOSIS — K660 Peritoneal adhesions (postprocedural) (postinfection): Secondary | ICD-10-CM | POA: Diagnosis present

## 2024-07-24 DIAGNOSIS — R7401 Elevation of levels of liver transaminase levels: Secondary | ICD-10-CM | POA: Diagnosis not present

## 2024-07-24 DIAGNOSIS — E282 Polycystic ovarian syndrome: Secondary | ICD-10-CM | POA: Diagnosis present

## 2024-07-24 DIAGNOSIS — R569 Unspecified convulsions: Secondary | ICD-10-CM | POA: Diagnosis present

## 2024-07-24 DIAGNOSIS — K810 Acute cholecystitis: Secondary | ICD-10-CM | POA: Diagnosis not present

## 2024-07-24 DIAGNOSIS — R110 Nausea: Secondary | ICD-10-CM | POA: Diagnosis not present

## 2024-07-24 DIAGNOSIS — F411 Generalized anxiety disorder: Secondary | ICD-10-CM | POA: Diagnosis present

## 2024-07-24 DIAGNOSIS — F5101 Primary insomnia: Secondary | ICD-10-CM | POA: Diagnosis present

## 2024-07-24 DIAGNOSIS — Z79899 Other long term (current) drug therapy: Secondary | ICD-10-CM | POA: Diagnosis not present

## 2024-07-24 DIAGNOSIS — K812 Acute cholecystitis with chronic cholecystitis: Secondary | ICD-10-CM | POA: Diagnosis present

## 2024-07-24 DIAGNOSIS — R7989 Other specified abnormal findings of blood chemistry: Secondary | ICD-10-CM | POA: Diagnosis present

## 2024-07-24 DIAGNOSIS — Z882 Allergy status to sulfonamides status: Secondary | ICD-10-CM | POA: Diagnosis not present

## 2024-07-24 DIAGNOSIS — R1030 Lower abdominal pain, unspecified: Secondary | ICD-10-CM | POA: Diagnosis not present

## 2024-07-24 DIAGNOSIS — F32A Depression, unspecified: Secondary | ICD-10-CM | POA: Diagnosis present

## 2024-07-24 DIAGNOSIS — Z803 Family history of malignant neoplasm of breast: Secondary | ICD-10-CM | POA: Diagnosis not present

## 2024-07-24 DIAGNOSIS — F17210 Nicotine dependence, cigarettes, uncomplicated: Secondary | ICD-10-CM | POA: Diagnosis present

## 2024-07-24 LAB — BASIC METABOLIC PANEL WITH GFR
Anion gap: 4 — ABNORMAL LOW (ref 5–15)
BUN: 6 mg/dL (ref 6–20)
CO2: 22 mmol/L (ref 22–32)
Calcium: 8 mg/dL — ABNORMAL LOW (ref 8.9–10.3)
Chloride: 109 mmol/L (ref 98–111)
Creatinine, Ser: 0.62 mg/dL (ref 0.44–1.00)
GFR, Estimated: >60 mL/min (ref >60)
Glucose, Bld: 119 mg/dL — ABNORMAL HIGH (ref 70–99)
Potassium: 3.6 mmol/L (ref 3.5–5.1)
Sodium: 135 mmol/L (ref 135–145)

## 2024-07-24 LAB — CBC
HCT: 34.1 % — ABNORMAL LOW (ref 36.0–46.0)
Hemoglobin: 11.4 g/dL — ABNORMAL LOW (ref 12.0–15.0)
MCH: 30.3 pg (ref 26.0–34.0)
MCHC: 33.4 g/dL (ref 30.0–36.0)
MCV: 90.7 fL (ref 80.0–100.0)
Platelets: 247 K/uL (ref 150–400)
RBC: 3.76 MIL/uL — ABNORMAL LOW (ref 3.87–5.11)
RDW: 12.6 % (ref 11.5–15.5)
WBC: 9.6 K/uL (ref 4.0–10.5)
nRBC: 0 % (ref 0.0–0.2)

## 2024-07-24 LAB — GLUCOSE, CAPILLARY: Glucose-Capillary: 118 mg/dL — ABNORMAL HIGH (ref 70–99)

## 2024-07-24 MED ORDER — SENNOSIDES 8.8 MG/5ML PO SYRP
10.0000 mL | ORAL_SOLUTION | Freq: Every day | ORAL | Status: DC
  Administered 2024-07-24 – 2024-07-28 (×5): 10 mL via ORAL
  Filled 2024-07-24 (×6): qty 10

## 2024-07-24 NOTE — Progress Notes (Signed)
 PROGRESS NOTE    Patient: Ashley Soto                            PCP: Beverli Casa                    DOB: 21-Jun-1981            DOA: 07/23/2024 FMW:[id number]             DOS: 07/24/2024, 1:29 PM   LOS: 0 days   Date of Service: The patient was seen and examined on 07/24/2024  Subjective:   The patient was seen and examined this morning. Hemodynamically stable. Has been n.p.o. overnight, improved nausea vomiting Some improvement of abdominal pain with analgesics   Brief Narrative:   Ashley Soto is a 43 y.o. female with PMH anxiety, depression, PCOS, benzodiazepine withdrawal seizures who presents emerged part for evaluation of abdominal pain.  Patient was seen yesterday morning for similar complaints and diagnosed with enteritis with a negative CT and pelvic ultrasound.  States that symptoms worsened tonight and return to the emergency department for further evaluation.  Denies chest pain, shortness of breath, headache, fever or other systemic symptoms     Assessment & Plan:   Principal Problem:   Intractable abdominal pain Active Problems:   Anxiety state   History of substance abuse (HCC)   Primary insomnia   History of seizure     Assessment and Plan: * Intractable abdominal pain - Etiology unclear -Improved abdominal pain, nausea and vomiting with analgesics  -CT abdomen/pelvis acute, nonspecific findings possible enteritis ureteral thickening -pelvic ultrasound-within normal limits  -Consulted and discussed the above findings with gastroenterology/and OB/GYN no acute findings or intervention were recommended at this point  -N.p.o. IVF half-normal saline, scheduled Reglan  -As needed Zofran  -As needed p.o./IV analgesics - Initiating clear liquid diet, advance as tolerated  If improved abdominal pain and tolerating p.o. possible discharge today or in a.m.   Anxiety state - Home medication of Celexa -As needed Xanax   History of substance abuse [facility]) h/o  status post detoxification for Xanax  abuse, May 2008, under the direction of Dr. Bernardino  - Denies any recent use or abuse colonic Xylac - Obtaining urine drug screen  Primary insomnia Continue as needed trazodone   History of seizure Send episodes not on any medications History of seizures in the context of withdrawal of benzodiazepine -Monitor closely -Continue recent use of benzodiazepines    Nutritional status:  The patient's BMI is: Body mass index is 22.2 kg/m. I agree with the assessment and plan as outlined  ------------------------------------------------------------------------------------------------------------------------------------------------  DVT prophylaxis:  heparin  injection 5,000 Units Start: 07/23/24 2200 SCDs Start: 07/23/24 0715   Code Status:   Code Status: Full Code  Family Communication: No family member present at bedside-  -Advance care planning has been discussed.   Admission status:   Status is: Observation The patient remains OBS appropriate and will d/c before 2 midnights.   Disposition: From  - home             Planning for discharge in 1-2 days   Procedures:   No admission procedures for hospital encounter.   Antimicrobials:  Anti-infectives (From admission, onward)    None        Medication:   busPIRone   15 mg Oral BID   dicyclomine   20 mg Oral BID   ferrous sulfate   325 mg Oral Q breakfast   FLUoxetine   40 mg Oral q morning   heparin   5,000 Units Subcutaneous Q8H   metoCLOPramide  (REGLAN ) injection  10 mg Intravenous Q8H   pantoprazole   40 mg Oral BID   sodium chloride  flush  3 mL Intravenous Q12H   sodium chloride  flush  3 mL Intravenous Q12H    acetaminophen  **OR** acetaminophen , ALPRAZolam , alum & mag hydroxide-simeth, bisacodyl , HYDROmorphone  (DILAUDID ) injection, ipratropium, levalbuterol , ondansetron  **OR** ondansetron  (ZOFRAN ) IV, oxyCODONE , senna-docusate, sodium phosphate , traZODone    Objective:   Vitals:    07/23/24 1749 07/23/24 1946 07/24/24 0429 07/24/24 1316  BP: (!) 84/44 100/63 (!) 101/55 (!) 93/47  Pulse: (!) 51 (!) 55 60 (!) 53  Resp: 17 15 16 17   Temp: 99.3 F (37.4 C) 99.5 F (37.5 C) 99.1 F (37.3 C) 98.6 F (37 C)  TempSrc: Oral Oral Oral Oral  SpO2: 100% 100% 96% 100%  Weight:   60.5 kg   Height:        Intake/Output Summary (Last 24 hours) at 07/24/2024 1329 Last data filed at 07/24/2024 1300 Gross per 24 hour  Intake 840 ml  Output --  Net 840 ml   Filed Weights   07/23/24 1052 07/23/24 1420 07/24/24 0429  Weight: 59 kg 60.4 kg 60.5 kg     Physical examination:   General:  AAO x 3,  cooperative, no distress;   HEENT:  Normocephalic, PERRL, otherwise with in Normal limits   Neuro:  CNII-XII intact. , normal motor and sensation, reflexes intact   Lungs:   Clear to auscultation BL, Respirations unlabored,  No wheezes / crackles  Cardio:    S1/S2, RRR, No murmure, No Rubs or Gallops   Abdomen:  Soft, non-tender, bowel sounds active all four quadrants, no guarding or peritoneal signs.  Muscular  skeletal:  Limited exam -global generalized weaknesses - in bed, able to move all 4 extremities,   2+ pulses,  symmetric, No pitting edema  Skin:  Dry, warm to touch, negative for any Rashes,  Wounds: Please see nursing documentation       ------------------------------------------------------------------------------------------------------------------------------------------    LABs:     Latest Ref Rng & Units 07/24/2024    4:30 AM 07/23/2024    5:11 AM 07/22/2024    2:21 AM  CBC  WBC 4.0 - 10.5 K/uL 9.6  11.8  14.2   Hemoglobin 12.0 - 15.0 g/dL 88.5  85.3  84.6   Hematocrit 36.0 - 46.0 % 34.1  42.2  44.0   Platelets 150 - 400 K/uL 247  333  331       Latest Ref Rng & Units 07/24/2024    4:30 AM 07/23/2024    5:11 AM 07/22/2024    2:21 AM  CMP  Glucose 70 - 99 mg/dL 880  883  886   BUN 6 - 20 mg/dL 6  12  15    Creatinine 0.44 - 1.00 mg/dL 9.37  9.30  9.29    Sodium 135 - 145 mmol/L 135  137  136   Potassium 3.5 - 5.1 mmol/L 3.6  4.2  3.9   Chloride 98 - 111 mmol/L 109  101  101   CO2 22 - 32 mmol/L 22  25  23    Calcium 8.9 - 10.3 mg/dL 8.0  9.0  9.4   Total Protein 6.5 - 8.1 g/dL  7.0  6.9   Total Bilirubin 0.0 - 1.2 mg/dL  1.2  1.0   Alkaline Phos 38 - 126 U/L  51  54   AST 15 -  41 U/L  17  21   ALT 0 - 44 U/L  17  18        Micro Results No results found for this or any previous visit (from the past 240 hours).  Radiology Reports No results found.  SIGNED: Adriana DELENA Grams, MD, FHM. FAAFP. [facility] Time spent - 55 min.  In seeing, evaluating and examining the patient. Reviewing medical records, labs, drawn plan of care. [facility],  Pager (please use [email or web address] to page/ text) Please use Epic Secure Chat for non-urgent communication (7AM-7PM)  If 7PM-7AM, please contact night-coverage [email or web address], 07/24/2024, 1:29 PM

## 2024-07-24 NOTE — Progress Notes (Addendum)
 Pt refused all meds by PO, unable to tolerate due to continuous vomiting. Pt declines Heparin , feels she does not need it because she is ambulatory. MD notified.

## 2024-07-24 NOTE — Plan of Care (Signed)
   Problem: Elimination: Goal: Will not experience complications related to urinary retention Outcome: Progressing   Problem: Pain Managment: Goal: General experience of comfort will improve and/or be controlled Outcome: Progressing   Problem: Safety: Goal: Ability to remain free from injury will improve Outcome: Progressing

## 2024-07-24 NOTE — Consult Note (Signed)
 OBSTETRICS AND GYNECOLOGY ATTENDING CONSULT NOTE  Consult Date: 07/24/2024  Reason for Consult: Abdominal pain Consulting Provider: Dr. Minela Bridgewater    Assessment/Plan: 1) Gastroenteritis - Patient has been improving with clear liquid diet - No evidence of acute GYN abnormality - Normal pelvic ultrasound  -There is no evidence of PID.  Urinary GCC was ordered for thoroughness however I anticipate negative results  No further gynecologic concern at this time and will sign off.  Total consultation time including face-to-face time with patient (>50% of time), reviewing chart and documentation: 45 minutes  Ashley Valadez, DO Attending Obstetrician & Gynecologist, Faculty Practice Center for [facility], [facility]   History of Present Illness: Ashley Soto is an 43 y.o. G4P 0222 female who was admitted due to abdominal pain, nausea and vomiting.  Patient states she initially presented to the ER Sunday night due to abdominal pain and vomiting.  She had tried over-the-counter Tylenol  ginger ale and a heating pad but nothing seemed to improved her symptoms.  She presented to the ER Monday morning she was treated with antibiotics and antiemetics and sent home.  The same thing occurred on Tuesday night.  Then when her symptoms got worse she returned on Wednesday and was admitted.  She notes considerable nausea and vomiting and has been unable to keep food down for several days.  She notes a low-grade temp at home.  She denies sick contacts she denies history of any GI concerns.  Patient has Nexplanon  for contraception and menses are irregular.  Her last menses was at least 1 to 2 months ago.  She denies vaginal bleeding, discharge, itching or irritation.  She denies this menorrhea.  She is sexually active with same partner.  Currently with her current antiemetics her nausea and vomiting has improved.  She has been able to tolerate clears.  Her last BM was several days ago.  She  reports no other acute complaints.  She denies headaches chest pain shortness of breath.  She denies urinary symptoms.  All other review of symptoms was negative  I have independently reviewed the pelvic ultrasound pictures.  Patient has a normal size uterus, no abnormalities noted.  Normal ovaries bilaterally-physiological ovarian follicles noted and not the source of her pain  Reviewed CT endometrial prominence is a benign finding and is mention pelvic ultrasound was normal  Pertinent OB/GYN History: No LMP recorded. OB History  Gravida Para Term Preterm AB Living  4 0 0 0 2 0  SAB IAB Ectopic Multiple Live Births  2 0 0 0 0    # Outcome Date GA Lbr Len/2nd Weight Sex Type Anes PTL Lv  4 Gravida           3 SAB 03/19/16     SAB     2 Gravida           1 SAB             Obstetric Comments  01/2015: SAB, D&C after failed medical management. Dr Arloa, [facility]   .gyn  Patient Active Problem List   Diagnosis Date Noted   Intractable abdominal pain 07/23/2024   Supervision of high risk pregnancy, antepartum 06/13/2021   Dichorionic diamniotic twin pregnancy 06/13/2021   Bilateral temporomandibular joint disorder 05/16/2018   History of seizure 05/16/2018   History of substance abuse (HCC) 05/16/2018   Anxiety state 11/25/2014   Primary insomnia 11/25/2014    Past Medical History:  Diagnosis Date   Anxiety and depression  History of seizures     D/T  BENZODIAZEPINE WITHDRAWAL. only had one seizure   History of substance abuse (HCC)    ALCOHOL AND DRUG FREE FOR 4 YEARS.  INVOLVED WITH AA   Polycystic disease, ovaries    Tinea corporis    Tobacco abuse     Past Surgical History:  Procedure Laterality Date   DILATION AND CURETTAGE OF UTERUS     DILATION AND CURETTAGE OF UTERUS N/A 03/19/2016   Procedure: SUCTION DILATATION AND CURETTAGE;  Surgeon: Bebe Furry, MD;  Location: [facility] ORS;  Service: Gynecology;  Laterality: N/A;   DORSAL COMPARTMENT RELEASE Left  12/28/2020   Procedure: Everitt Curt release, left;  Surgeon: Kathlynn Sharper, MD;  Location: [facility] ORS;  Service: Orthopedics;  Laterality: Left;   TONSILLECTOMY     2022- Primary C-section due to twins  Family History  Problem Relation Age of Onset   Breast cancer Mother        early 57s   Breast cancer Paternal Grandmother        early 63s    Social History:  reports that she has been smoking cigarettes. She has never used smokeless tobacco. She reports that she does not currently use alcohol. She reports that she does not use drugs.  Allergies:  Allergies  Allergen Reactions   Paroxetine Hcl     Suicidal thoughts    Sulfa Antibiotics Hives    Review of Systems: Pertinent items are noted in HPI.  Focused Physical Examination: BP (!) 101/55 (BP Location: Left Arm)   Pulse 60   Temp 99.1 F (37.3 C) (Oral)   Resp 16   Ht 5' 5 (1.651 m)   Wt 60.5 kg   SpO2 96%   BMI 22.20 kg/m  CONSTITUTIONAL: No acute distress, appears fatigues EYES: Conjunctivae and EOM are normal. Pupils are equal, round, and reactive to light.  NECK: Normal range of motion, supple, no masses.   SKIN: Skin is warm and dry. No rash noted. Not diaphoretic. No erythema. No pallor. NEUROLGIC: Alert and oriented to person, place, and time. Normal reflexes, muscle tone coordination.  PSYCHIATRIC: Normal mood and affect. Normal behavior. Normal judgment and thought content. CARDIOVASCULAR: Normal heart rate noted, regular rhythm RESPIRATORY: Clear to auscultation bilaterally. Effort and breath sounds normal, no problems with respiration noted. ABDOMEN: Soft, normal bowel sounds, no distention noted.  Mild tenderness noted in lower abdomen.  No rebound or guarding. PELVIC: Deferred-examination would not change management MUSCULOSKELETAL: Normal range of motion. No tenderness.  No cyanosis, clubbing, or edema.    Labs and Imaging: Results for orders placed or performed during the hospital encounter of  07/23/24 (from the past 72 hours)  Comprehensive metabolic panel     Status: Abnormal   Collection Time: 07/23/24  5:11 AM  Result Value Ref Range   Sodium 137 135 - 145 mmol/L   Potassium 4.2 3.5 - 5.1 mmol/L   Chloride 101 98 - 111 mmol/L   CO2 25 22 - 32 mmol/L   Glucose, Bld 116 (H) 70 - 99 mg/dL    Comment: Glucose reference range applies only to samples taken after fasting for at least 8 hours.   BUN 12 6 - 20 mg/dL   Creatinine, Ser 9.30 0.44 - 1.00 mg/dL   Calcium 9.0 8.9 - 89.6 mg/dL   Total Protein 7.0 6.5 - 8.1 g/dL   Albumin 3.8 3.5 - 5.0 g/dL   AST 17 15 - 41 U/L   ALT 17 0 -  44 U/L   Alkaline Phosphatase 51 38 - 126 U/L   Total Bilirubin 1.2 0.0 - 1.2 mg/dL   GFR, Estimated >39 >39 mL/min    Comment: (NOTE) Calculated using the CKD-EPI Creatinine Equation (2021)    Anion gap 11 5 - 15    Comment: Performed at [facility], [address], [place], [place] [address]  CBC with Differential     Status: Abnormal   Collection Time: 07/23/24  5:11 AM  Result Value Ref Range   WBC 11.8 (H) 4.0 - 10.5 K/uL   RBC 4.71 3.87 - 5.11 MIL/uL   Hemoglobin 14.6 12.0 - 15.0 g/dL   HCT 57.7 63.9 - 53.9 %   MCV 89.6 80.0 - 100.0 fL   MCH 31.0 26.0 - 34.0 pg   MCHC 34.6 30.0 - 36.0 g/dL   RDW 87.5 88.4 - 84.4 %   Platelets 333 150 - 400 K/uL   nRBC 0.0 0.0 - 0.2 %   Neutrophils Relative % 71 %   Neutro Abs 8.5 (H) 1.7 - 7.7 K/uL   Lymphocytes Relative 21 %   Lymphs Abs 2.5 0.7 - 4.0 K/uL   Monocytes Relative 6 %   Monocytes Absolute 0.7 0.1 - 1.0 K/uL   Eosinophils Relative 0 %   Eosinophils Absolute 0.0 0.0 - 0.5 K/uL   Basophils Relative 1 %   Basophils Absolute 0.1 0.0 - 0.1 K/uL   Immature Granulocytes 1 %   Abs Immature Granulocytes 0.06 0.00 - 0.07 K/uL    Comment: Performed at [facility], [address], [place], [place] [address]  Lipase, blood     Status: None   Collection Time: 07/23/24  5:11 AM  Result Value Ref Range   Lipase 43 11 - 51 U/L    Comment:  Performed at [facility], [address] [place], [place] [address]  Lactic acid, plasma     Status: None   Collection Time: 07/23/24  5:11 AM  Result Value Ref Range   Lactic Acid, Venous 1.5 0.5 - 1.9 mmol/L    Comment: Performed at [facility], [address] [place], [place] [address]  Sedimentation rate     Status: None   Collection Time: 07/23/24  5:11 AM  Result Value Ref Range   Sed Rate 5 0 - 20 mm/hr    Comment: Performed at [facility], [address] [place], [place] [address]  HIV Antibody (routine testing w rflx)     Status: None   Collection Time: 07/23/24  7:24 AM  Result Value Ref Range   HIV Screen 4th Generation wRfx Non Reactive Non Reactive    Comment: Performed at [facility], [address] [place], [place] [address]  Magnesium     Status: None   Collection Time: 07/23/24  7:24 AM  Result Value Ref Range   Magnesium 1.7 1.7 - 2.4 mg/dL    Comment: Performed at [facility], [address], [place], [place] [address]  Phosphorus     Status: None   Collection Time: 07/23/24  7:24 AM  Result Value Ref Range   Phosphorus 2.5 2.5 - 4.6 mg/dL    Comment: Performed at [facility], [address] [place], [place] [address]  Procalcitonin     Status: None   Collection Time: 07/23/24  7:24 AM  Result Value Ref Range   Procalcitonin <0.10 ng/mL    Comment:        Interpretation: PCT (Procalcitonin) <= 0.5 ng/mL:  Systemic infection (sepsis) is not likely. Local bacterial infection is possible. (NOTE)       Sepsis PCT Algorithm           Lower Respiratory Tract                                      Infection PCT Algorithm    ----------------------------     ----------------------------         PCT < 0.25 ng/mL                PCT < 0.10 ng/mL          Strongly encourage             Strongly discourage   discontinuation of antibiotics    initiation of antibiotics    ----------------------------     -----------------------------       PCT 0.25 - 0.50 ng/mL             PCT 0.10 - 0.25 ng/mL               OR       >80% decrease in PCT            Discourage initiation of                                            antibiotics      Encourage discontinuation           of antibiotics    ----------------------------     -----------------------------         PCT >= 0.50 ng/mL              PCT 0.26 - 0.50 ng/mL               AND        <80% decrease in PCT             Encourage initiation of                                             antibiotics       Encourage continuation           of antibiotics    ----------------------------     -----------------------------        PCT >= 0.50 ng/mL                  PCT > 0.50 ng/mL               AND         increase in PCT                  Strongly encourage                                      initiation of antibiotics    Strongly encourage escalation           of antibiotics                                     -----------------------------  PCT <= 0.25 ng/mL                                                 OR                                        > 80% decrease in PCT                                      Discontinue / Do not initiate                                             antibiotics  Performed at [facility], [address], [place], [place] [address]   C-reactive protein     Status: None   Collection Time: 07/23/24  7:24 AM  Result Value Ref Range   CRP 0.5 <1.0 mg/dL    Comment: Performed at [facility], [address], [place], [place] [address]  Rapid urine drug screen (hospital performed)     Status: Abnormal   Collection Time: 07/23/24 11:47 AM  Result Value Ref Range   Opiates POSITIVE (A) NONE DETECTED   Cocaine NONE DETECTED NONE DETECTED   Benzodiazepines NONE DETECTED NONE DETECTED   Amphetamines POSITIVE (A) NONE DETECTED    Comment: (NOTE) Trazodone  is metabolized in vivo to several metabolites, including pharmacologically  active m-CPP, which is excreted in the urine. Immunoassay screens for amphetamines and MDMA have potential cross-reactivity with these compounds and may provide false positive  results.     Tetrahydrocannabinol NONE DETECTED NONE DETECTED   Barbiturates NONE DETECTED NONE DETECTED    Comment: (NOTE) DRUG SCREEN FOR MEDICAL PURPOSES ONLY.  IF CONFIRMATION IS NEEDED FOR ANY PURPOSE, NOTIFY LAB WITHIN 5 DAYS.  LOWEST DETECTABLE LIMITS FOR URINE DRUG SCREEN Drug Class                     Cutoff (ng/mL) Amphetamine and metabolites    1000 Barbiturate and metabolites    200 Benzodiazepine                 200 Opiates and metabolites        300 Cocaine and metabolites        300 THC                            50 Performed at [facility], [address], [place], [place] [address]   Basic metabolic panel     Status: Abnormal   Collection Time: 07/24/24  4:30 AM  Result Value Ref Range   Sodium 135 135 - 145 mmol/L   Potassium 3.6 3.5 - 5.1 mmol/L   Chloride 109 98 - 111 mmol/L   CO2 22 22 - 32 mmol/L   Glucose, Bld 119 (H) 70 - 99 mg/dL    Comment: Glucose reference range applies only to samples taken after fasting for at least 8 hours.   BUN 6 6 - 20 mg/dL   Creatinine,  Ser 0.62 0.44 - 1.00 mg/dL   Calcium 8.0 (L) 8.9 - 10.3 mg/dL   GFR, Estimated >39 >39 mL/min    Comment: (NOTE) Calculated using the CKD-EPI Creatinine Equation (2021)    Anion gap 4 (L) 5 - 15    Comment: Performed at [facility], [address], [place], [place] [address]  CBC     Status: Abnormal   Collection Time: 07/24/24  4:30 AM  Result Value Ref Range   WBC 9.6 4.0 - 10.5 K/uL   RBC 3.76 (L) 3.87 - 5.11 MIL/uL   Hemoglobin 11.4 (L) 12.0 - 15.0 g/dL   HCT 65.8 (L) 63.9 - 53.9 %   MCV 90.7 80.0 - 100.0 fL   MCH 30.3 26.0 - 34.0 pg   MCHC 33.4 30.0 - 36.0 g/dL   RDW 87.3 88.4 - 84.4 %   Platelets 247 150 - 400 K/uL   nRBC 0.0 0.0 - 0.2 %    Comment: Performed at [facility], [address]  [place], [place] [address]  Glucose, capillary     Status: Abnormal   Collection Time: 07/24/24  7:06 AM  Result Value Ref Range   Glucose-Capillary 118 (H) 70 - 99 mg/dL    Comment: Glucose reference range applies only to samples taken after fasting for at least 8 hours.   Comment 1 Notify RN    Comment 2 Document in Chart     CT ABDOMEN PELVIS W CONTRAST Result Date: 07/23/2024 EXAM: CT ABDOMEN AND PELVIS WITH CONTRAST 07/23/2024 05:32:12 AM TECHNIQUE: CT of the abdomen and pelvis was performed with the administration of intravenous contrast. Multiplanar reformatted images are provided for review. Automated exposure control, iterative reconstruction, and/or weight based adjustment of the mA/kV was utilized to reduce the radiation dose to as low as reasonably achievable. COMPARISON: CT abdomen and pelvis with contrast 07/21/2024. Pelvic ultrasound 07/21/2024. CLINICAL HISTORY: Abdominal pain, acute, nonlocalized. Pt c/o abdominal pain and N/V. Was seen here on 8/4 and 8/5 for same and dx with enteritis. Pt reports symptoms returned last night after eating dinner. FINDINGS: LOWER CHEST: No acute abnormality. LIVER: The liver is unremarkable. GALLBLADDER AND BILE DUCTS: Gallbladder is unremarkable. No biliary ductal dilatation. SPLEEN: No acute abnormality. PANCREAS: No acute abnormality. ADRENAL GLANDS: No acute abnormality. KIDNEYS, URETERS AND BLADDER: No stones in the kidneys or ureters. No hydronephrosis. No perinephric or periureteral stranding. Urinary bladder is unremarkable. GI AND BOWEL: Previously noted areas of small bowel wall thickening are no longer present. No significant inflammatory changes of the bowel are present. PERITONEUM AND RETROPERITONEUM: Minimal free fluid in the anatomic pelvis is likely physiologic. No free air. VASCULATURE: Aorta is normal in caliber. LYMPH NODES: No lymphadenopathy. REPRODUCTIVE ORGANS: Edematous changes of the uterus are again noted. Prominent follicles in both  ovaries are noted. BONES AND SOFT TISSUES: No acute osseous abnormality. No focal soft tissue abnormality. IMPRESSION: 1. No acute findings in the abdomen or pelvis. 2. Edematous changes of the uterus and prominent follicles in both ovaries. Minimal free fluid in the anatomic pelvis, likely physiologic. Electronically signed by: Lonni Necessary MD 07/23/2024 05:57 AM EDT RP Workstation: [id number]

## 2024-07-24 NOTE — Progress Notes (Signed)
 Pt requested oxy po for moderate pain. Did not tolerate well, pt on the bathroom floor w/husband experiencing an episode of vomiting complaining her stomach pain was much worse and hurt badly. Administered dilaudid  via IV. Pt currently resting quietly. Rise and fall of chest visualized. Pts husband expressed concern wife would be discharged without having these issues resolved.

## 2024-07-24 NOTE — Progress Notes (Signed)
 Gastroenterology Progress Note   Referring Provider: No ref. provider found Primary Care Physician:  Beverli Casa Primary Gastroenterologist: Lamar HERO.Rourk, MD, not established.   Patient ID: Ashley Soto; [id number]; 1981-01-03    Subjective   Has been having abdominal pain within 45 minutes-an hour after meals and can last for several hours at a time.  Earlier this week it was lasting for 6-8+ hours at a time.  Denies it being sharp or stabbing in nature and described as dull although is severe enough that it doubles her over.  Does not get any relief with dicyclomine  at home.  Again denies any overt constipation or diarrhea.   Objective   Vital signs in last 24 hours Temp:  [98.9 F (37.2 C)-99.5 F (37.5 C)] 99.1 F (37.3 C) (08/07 0429) Pulse Rate:  [49-72] 60 (08/07 0429) Resp:  [14-18] 16 (08/07 0429) BP: (84-109)/(44-63) 101/55 (08/07 0429) SpO2:  [96 %-100 %] 96 % (08/07 0429) Weight:  [59 kg-60.5 kg] 60.5 kg (08/07 0429) Last BM Date : 07/20/24  Physical Exam General:   Alert and oriented, pleasant Head:  Normocephalic and atraumatic. Eyes:  No icterus, sclera clear. Conjuctiva pink.  Mouth:  Without lesions, mucosa pink and moist.  Abdomen:  Bowel sounds present, non-distended. Soft, ttp to epigastrium and lower abdomen. No HSM or hernias noted. No rebound or guarding. No masses appreciated  Neurologic:  Alert and  oriented x4;  grossly normal neurologically. Psych:  Alert and cooperative. Normal mood and affect.  Intake/Output from previous day: 08/06 0701 - 08/07 0700 In: 240 [P.O.:240] Out: -  Intake/Output this shift: No intake/output data recorded.  Lab Results  Recent Labs    07/22/24 0221 07/23/24 0511 07/24/24 0430  WBC 14.2* 11.8* 9.6  HGB 15.3* 14.6 11.4*  HCT 44.0 42.2 34.1*  PLT 331 333 247   BMET Recent Labs    07/22/24 0221 07/23/24 0511 07/24/24 0430  NA 136 137 135  K 3.9 4.2 3.6  CL 101 101 109  CO2 23 25 22   GLUCOSE  113* 116* 119*  BUN 15 12 6   CREATININE 0.70 0.69 0.62  CALCIUM 9.4 9.0 8.0*   LFT Recent Labs    07/22/24 0221 07/23/24 0511  PROT 6.9 7.0  ALBUMIN 3.7 3.8  AST 21 17  ALT 18 17  ALKPHOS 54 51  BILITOT 1.0 1.2   PT/INR No results for input(s): LABPROT, INR in the last 72 hours. Hepatitis Panel No results for input(s): HEPBSAG, HCVAB, HEPAIGM, HEPBIGM in the last 72 hours.  Studies/Results CT ABDOMEN PELVIS W CONTRAST Result Date: 07/23/2024 EXAM: CT ABDOMEN AND PELVIS WITH CONTRAST 07/23/2024 05:32:12 AM TECHNIQUE: CT of the abdomen and pelvis was performed with the administration of intravenous contrast. Multiplanar reformatted images are provided for review. Automated exposure control, iterative reconstruction, and/or weight based adjustment of the mA/kV was utilized to reduce the radiation dose to as low as reasonably achievable. COMPARISON: CT abdomen and pelvis with contrast 07/21/2024. Pelvic ultrasound 07/21/2024. CLINICAL HISTORY: Abdominal pain, acute, nonlocalized. Pt c/o abdominal pain and N/V. Was seen here on 8/4 and 8/5 for same and dx with enteritis. Pt reports symptoms returned last night after eating dinner. FINDINGS: LOWER CHEST: No acute abnormality. LIVER: The liver is unremarkable. GALLBLADDER AND BILE DUCTS: Gallbladder is unremarkable. No biliary ductal dilatation. SPLEEN: No acute abnormality. PANCREAS: No acute abnormality. ADRENAL GLANDS: No acute abnormality. KIDNEYS, URETERS AND BLADDER: No stones in the kidneys or ureters. No hydronephrosis. No perinephric or periureteral  stranding. Urinary bladder is unremarkable. GI AND BOWEL: Previously noted areas of small bowel wall thickening are no longer present. No significant inflammatory changes of the bowel are present. PERITONEUM AND RETROPERITONEUM: Minimal free fluid in the anatomic pelvis is likely physiologic. No free air. VASCULATURE: Aorta is normal in caliber. LYMPH NODES: No lymphadenopathy.  REPRODUCTIVE ORGANS: Edematous changes of the uterus are again noted. Prominent follicles in both ovaries are noted. BONES AND SOFT TISSUES: No acute osseous abnormality. No focal soft tissue abnormality. IMPRESSION: 1. No acute findings in the abdomen or pelvis. 2. Edematous changes of the uterus and prominent follicles in both ovaries. Minimal free fluid in the anatomic pelvis, likely physiologic. Electronically signed by: Lonni Necessary MD 07/23/2024 05:57 AM EDT RP Workstation: [id number]   US  Pelvis Complete Result Date: 07/21/2024 CLINICAL DATA:  eval endometrial thickening on CT. EXAM: TRANSABDOMINAL AND TRANSVAGINAL ULTRASOUND OF PELVIS TECHNIQUE: Both transabdominal and transvaginal ultrasound examinations of the pelvis were performed. Transabdominal technique was performed for global imaging of the pelvis including uterus, ovaries, adnexal regions, and pelvic cul-de-sac. It was necessary to proceed with endovaginal exam following the transabdominal exam to visualize the uterus and endometrium. COMPARISON:  CT scan abdomen and pelvis from earlier the same day. FINDINGS: Uterus Measurements: 4.3 x 4.3 x 7.8 cm = volume: 74.3 mL. No fibroids or other mass visualized. Endometrium Thickness: 6.6 mm.  No focal abnormality visualized. Right ovary Measurements: 2.3 x 2.8 x 3.3 cm = volume: 11.1 mL. Normal appearance/no adnexal mass. Left ovary Measurements: 1.9 x 2.2 x 3.6 cm = volume: 8.1 mL. Normal appearance/no adnexal mass. Other findings No abnormal free fluid. IMPRESSION: *Unremarkable pelvic ultrasound exam. Electronically Signed   By: Ree Molt M.D.   On: 07/21/2024 12:36   US  Transvaginal Non-OB Result Date: 07/21/2024 CLINICAL DATA:  eval endometrial thickening on CT. EXAM: TRANSABDOMINAL AND TRANSVAGINAL ULTRASOUND OF PELVIS TECHNIQUE: Both transabdominal and transvaginal ultrasound examinations of the pelvis were performed. Transabdominal technique was performed for global imaging of the  pelvis including uterus, ovaries, adnexal regions, and pelvic cul-de-sac. It was necessary to proceed with endovaginal exam following the transabdominal exam to visualize the uterus and endometrium. COMPARISON:  CT scan abdomen and pelvis from earlier the same day. FINDINGS: Uterus Measurements: 4.3 x 4.3 x 7.8 cm = volume: 74.3 mL. No fibroids or other mass visualized. Endometrium Thickness: 6.6 mm.  No focal abnormality visualized. Right ovary Measurements: 2.3 x 2.8 x 3.3 cm = volume: 11.1 mL. Normal appearance/no adnexal mass. Left ovary Measurements: 1.9 x 2.2 x 3.6 cm = volume: 8.1 mL. Normal appearance/no adnexal mass. Other findings No abnormal free fluid. IMPRESSION: *Unremarkable pelvic ultrasound exam. Electronically Signed   By: Ree Molt M.D.   On: 07/21/2024 12:36   CT ABDOMEN PELVIS W CONTRAST Result Date: 07/21/2024 CLINICAL DATA:  Abdominal pain. EXAM: CT ABDOMEN AND PELVIS WITH CONTRAST TECHNIQUE: Multidetector CT imaging of the abdomen and pelvis was performed using the standard protocol following bolus administration of intravenous contrast. RADIATION DOSE REDUCTION: This exam was performed according to the departmental dose-optimization program which includes automated exposure control, adjustment of the mA and/or kV according to patient size and/or use of iterative reconstruction technique. CONTRAST:  OMNIPAQUE  IOHEXOL  300 MG/ML  SOLN COMPARISON:  None Available. FINDINGS: Lower chest: Heart is normal size.  Lung bases are clear. Hepatobiliary: Liver, gallbladder and biliary tree are normal. Pancreas: Normal. Spleen: Normal. Adrenals/Urinary Tract: Adrenal glands are normal. Kidneys are normal in size without hydronephrosis or nephrolithiasis.  Ureters and bladder are normal. Stomach/Bowel: Stomach is normal. Small bowel is normal in caliber and demonstrates minimal wall thickening over several jejunal loops in the left abdomen which is nonspecific. No adjacent free fluid or  inflammatory change. Appendix is normal. Colon is normal. Vascular/Lymphatic: Abdominal aorta is normal in caliber. Remaining vascular structures are unremarkable. No significant adenopathy. Reproductive: Uterus normal size with mild prominence of the endometrium which is not well evaluated by CT. Ovaries are unremarkable. Other: No significant free fluid or focal inflammatory change. Musculoskeletal: No focal abnormality. IMPRESSION: 1. Minimal wall thickening over several jejunal loops in the left abdomen which is nonspecific and may be due to mild enteritis of infectious or inflammatory nature. No adjacent free fluid or inflammatory change. 2. Mild prominence of the endometrium which is not well evaluated by CT. Pelvic ultrasound would be helpful for further evaluation. Electronically Signed   By: Toribio Agreste M.D.   On: 07/21/2024 10:45    Assessment  43 y.o. female with a history of PCOS who presented to the ED with ongoing abdominal pain with vomiting as well.  2 prior ED visits earlier this week.  GI consulted for further evaluation.  Intractable abdominal pain, N/V: - Sudden onset this past Sunday 8/3 - CT A/P on 8/4 concerning for enteritis although non specific - CT A/P 8/6 without any bowel inflammation but edematous uterus/prominent follicles in both ovaries - Pelvic US  unremarkable. - pain waxes/wanes but dull and constant when present.  - is a post prandial component to symptoms - Hgb normal yesterday (14.6 although was 15.3 on 8/5 which could have been secondary to dehydration) and down to 11.4 today - possibly somewhat hemodiluted with adequate hydration - Leukocytosis resolved today - previously up to 17 on 8/4. - Unable to collect stool studies given no diarrhea.  - Denies BRBPR, melena, weight loss, diarrhea, changes in bowel habits, lack of appetite, or overt constipation  Etiology unclear at this time. Vomiting occurring more so secondary to the pain. Previously with some  leukocytosis up to WBC 17.3 on 8/4. Given possible enteritis on initial CT could consider ileocolonoscopy vs CTE for further evaluation after GYN consult has been completed. She notably has been hypotensive the last 24 hours. Query constipation as cause however given postprandial component to pain and some hypotension if this could be some mild intermittent ischemia. Could consider CTA - will discuss with Dr. Cinderella.   Plan / Recommendations  GYN evaluation Continue clears Anti emetics as needed.  Discuss further workup with Dr Cinderella with potential imaging vs colonoscopy.     LOS: 0 days    07/24/2024, 10:44 AM   Charmaine Melia, MSN, FNP-BC, AGACNP-BC [facility]

## 2024-07-24 NOTE — Progress Notes (Signed)
 Pt refuses heparin . States she is ambulatory. MD was notified.

## 2024-07-25 ENCOUNTER — Inpatient Hospital Stay (HOSPITAL_COMMUNITY)

## 2024-07-25 DIAGNOSIS — R109 Unspecified abdominal pain: Secondary | ICD-10-CM | POA: Diagnosis not present

## 2024-07-25 LAB — BASIC METABOLIC PANEL WITH GFR
Anion gap: 7 (ref 5–15)
BUN: 7 mg/dL (ref 6–20)
CO2: 26 mmol/L (ref 22–32)
Calcium: 8.3 mg/dL — ABNORMAL LOW (ref 8.9–10.3)
Chloride: 102 mmol/L (ref 98–111)
Creatinine, Ser: 0.57 mg/dL (ref 0.44–1.00)
GFR, Estimated: >60 mL/min (ref >60)
Glucose, Bld: 151 mg/dL — ABNORMAL HIGH (ref 70–99)
Potassium: 3.9 mmol/L (ref 3.5–5.1)
Sodium: 135 mmol/L (ref 135–145)

## 2024-07-25 LAB — CBC
HCT: 37.7 % (ref 36.0–46.0)
Hemoglobin: 12.6 g/dL (ref 12.0–15.0)
MCH: 30.4 pg (ref 26.0–34.0)
MCHC: 33.4 g/dL (ref 30.0–36.0)
MCV: 91.1 fL (ref 80.0–100.0)
Platelets: 275 K/uL (ref 150–400)
RBC: 4.14 MIL/uL (ref 3.87–5.11)
RDW: 12.4 % (ref 11.5–15.5)
WBC: 11.9 K/uL — ABNORMAL HIGH (ref 4.0–10.5)
nRBC: 0 % (ref 0.0–0.2)

## 2024-07-25 LAB — GC/CHLAMYDIA PROBE AMP (~~LOC~~) NOT AT ARMC
Chlamydia: NEGATIVE
Comment: NEGATIVE
Comment: NORMAL
Neisseria Gonorrhea: NEGATIVE

## 2024-07-25 LAB — GLUCOSE, CAPILLARY: Glucose-Capillary: 154 mg/dL — ABNORMAL HIGH (ref 70–99)

## 2024-07-25 MED ORDER — IOHEXOL 350 MG/ML SOLN
100.0000 mL | Freq: Once | INTRAVENOUS | Status: AC | PRN
  Administered 2024-07-25: 80 mL via INTRAVENOUS

## 2024-07-25 MED ORDER — ORAL CARE MOUTH RINSE: 15.0000 mL | OROMUCOSAL | Status: DC | PRN

## 2024-07-25 MED ORDER — DEXTROSE-SODIUM CHLORIDE 5-0.45 % IV SOLN: INTRAVENOUS | Status: AC

## 2024-07-25 MED ORDER — POLYETHYLENE GLYCOL 3350 17 G PO PACK
17.0000 g | PACK | Freq: Every day | ORAL | Status: DC
  Administered 2024-07-25 – 2024-07-28 (×4): 17 g via ORAL
  Filled 2024-07-25 (×3): qty 1

## 2024-07-25 NOTE — Progress Notes (Signed)
 Gastroenterology Progress Note   Referring Provider: No ref. provider found Primary Care Physician:  Beverli Casa Primary Gastroenterologist:  Lamar HERO.Rourk, MD  Patient ID: Ashley Soto; [id number]; 04/11/1981   Subjective   Pain began 1.5-2 hours after solid meal last night and remained in the lower abdomen. Was doubled over in the bathroom and after pain present for short time she had sudden vomiting. No nausea prodrome. Also reports diaphoresis and warm feeling after all of this that lasted for hours - usually improves shortly after lying down in bed. No bloody emesis. No undigested food.   Denies family history of IBD or celiac disease. No BM. Not much flatus.    Objective   Vital signs in last 24 hours Temp:  [98.2 F (36.8 C)-98.9 F (37.2 C)] (P) 98.2 F (36.8 C) (08/08 0541) Pulse Rate:  [48-53] (P) 51 (08/08 0541) Resp:  [16-17] (P) 16 (08/08 0541) BP: (93-126)/(47-67) (P) 118/62 (08/08 0541) SpO2:  [99 %-100 %] (P) 99 % (08/08 0541) Weight:  [60.1 kg] 60.1 kg (08/08 0500) Last BM Date : 07/20/24  Physical Exam General:   Alert and oriented, pleasant Head:  Normocephalic and atraumatic. Eyes:  No icterus, sclera clear. Conjuctiva pink.  Mouth:  Without lesions, mucosa pink and moist.  Abdomen:  Bowel sounds hypoactive, soft, non-distended. Mild ttp to lower abdomen. No HSM or hernias noted. No rebound or guarding. No masses appreciated  Msk:  Symmetrical without gross deformities. Normal posture.  Neurologic:  Alert and  oriented x4;  grossly normal neurologically. Skin:  Warm and dry, intact without significant lesions.  Psych:  Alert and cooperative. Normal mood and affect.  Intake/Output from previous day: 08/07 0701 - 08/08 0700 In: 600 [P.O.:600] Out: -  Intake/Output this shift: Total I/O In: 480 [P.O.:480] Out: -   Lab Results  Recent Labs    07/23/24 0511 07/24/24 0430 07/25/24 0529  WBC 11.8* 9.6 11.9*  HGB 14.6 11.4* 12.6  HCT 42.2  34.1* 37.7  PLT 333 247 275   BMET Recent Labs    07/23/24 0511 07/24/24 0430 07/25/24 0529  NA 137 135 135  K 4.2 3.6 3.9  CL 101 109 102  CO2 25 22 26   GLUCOSE 116* 119* 151*  BUN 12 6 7   CREATININE 0.69 0.62 0.57  CALCIUM 9.0 8.0* 8.3*   LFT Recent Labs    07/23/24 0511  PROT 7.0  ALBUMIN 3.8  AST 17  ALT 17  ALKPHOS 51  BILITOT 1.2   PT/INR No results for input(s): LABPROT, INR in the last 72 hours. Hepatitis Panel No results for input(s): HEPBSAG, HCVAB, HEPAIGM, HEPBIGM in the last 72 hours.   Studies/Results CT ABDOMEN PELVIS W CONTRAST Result Date: 07/23/2024 EXAM: CT ABDOMEN AND PELVIS WITH CONTRAST 07/23/2024 05:32:12 AM TECHNIQUE: CT of the abdomen and pelvis was performed with the administration of intravenous contrast. Multiplanar reformatted images are provided for review. Automated exposure control, iterative reconstruction, and/or weight based adjustment of the mA/kV was utilized to reduce the radiation dose to as low as reasonably achievable. COMPARISON: CT abdomen and pelvis with contrast 07/21/2024. Pelvic ultrasound 07/21/2024. CLINICAL HISTORY: Abdominal pain, acute, nonlocalized. Pt c/o abdominal pain and N/V. Was seen here on 8/4 and 8/5 for same and dx with enteritis. Pt reports symptoms returned last night after eating dinner. FINDINGS: LOWER CHEST: No acute abnormality. LIVER: The liver is unremarkable. GALLBLADDER AND BILE DUCTS: Gallbladder is unremarkable. No biliary ductal dilatation. SPLEEN: No acute abnormality. PANCREAS: No acute  abnormality. ADRENAL GLANDS: No acute abnormality. KIDNEYS, URETERS AND BLADDER: No stones in the kidneys or ureters. No hydronephrosis. No perinephric or periureteral stranding. Urinary bladder is unremarkable. GI AND BOWEL: Previously noted areas of small bowel wall thickening are no longer present. No significant inflammatory changes of the bowel are present. PERITONEUM AND RETROPERITONEUM: Minimal free  fluid in the anatomic pelvis is likely physiologic. No free air. VASCULATURE: Aorta is normal in caliber. LYMPH NODES: No lymphadenopathy. REPRODUCTIVE ORGANS: Edematous changes of the uterus are again noted. Prominent follicles in both ovaries are noted. BONES AND SOFT TISSUES: No acute osseous abnormality. No focal soft tissue abnormality. IMPRESSION: 1. No acute findings in the abdomen or pelvis. 2. Edematous changes of the uterus and prominent follicles in both ovaries. Minimal free fluid in the anatomic pelvis, likely physiologic. Electronically signed by: Lonni Necessary MD 07/23/2024 05:57 AM EDT RP Workstation: [id number]   US  Pelvis Complete Result Date: 07/21/2024 CLINICAL DATA:  eval endometrial thickening on CT. EXAM: TRANSABDOMINAL AND TRANSVAGINAL ULTRASOUND OF PELVIS TECHNIQUE: Both transabdominal and transvaginal ultrasound examinations of the pelvis were performed. Transabdominal technique was performed for global imaging of the pelvis including uterus, ovaries, adnexal regions, and pelvic cul-de-sac. It was necessary to proceed with endovaginal exam following the transabdominal exam to visualize the uterus and endometrium. COMPARISON:  CT scan abdomen and pelvis from earlier the same day. FINDINGS: Uterus Measurements: 4.3 x 4.3 x 7.8 cm = volume: 74.3 mL. No fibroids or other mass visualized. Endometrium Thickness: 6.6 mm.  No focal abnormality visualized. Right ovary Measurements: 2.3 x 2.8 x 3.3 cm = volume: 11.1 mL. Normal appearance/no adnexal mass. Left ovary Measurements: 1.9 x 2.2 x 3.6 cm = volume: 8.1 mL. Normal appearance/no adnexal mass. Other findings No abnormal free fluid. IMPRESSION: *Unremarkable pelvic ultrasound exam. Electronically Signed   By: Ree Molt M.D.   On: 07/21/2024 12:36   US  Transvaginal Non-OB Result Date: 07/21/2024 CLINICAL DATA:  eval endometrial thickening on CT. EXAM: TRANSABDOMINAL AND TRANSVAGINAL ULTRASOUND OF PELVIS TECHNIQUE: Both  transabdominal and transvaginal ultrasound examinations of the pelvis were performed. Transabdominal technique was performed for global imaging of the pelvis including uterus, ovaries, adnexal regions, and pelvic cul-de-sac. It was necessary to proceed with endovaginal exam following the transabdominal exam to visualize the uterus and endometrium. COMPARISON:  CT scan abdomen and pelvis from earlier the same day. FINDINGS: Uterus Measurements: 4.3 x 4.3 x 7.8 cm = volume: 74.3 mL. No fibroids or other mass visualized. Endometrium Thickness: 6.6 mm.  No focal abnormality visualized. Right ovary Measurements: 2.3 x 2.8 x 3.3 cm = volume: 11.1 mL. Normal appearance/no adnexal mass. Left ovary Measurements: 1.9 x 2.2 x 3.6 cm = volume: 8.1 mL. Normal appearance/no adnexal mass. Other findings No abnormal free fluid. IMPRESSION: *Unremarkable pelvic ultrasound exam. Electronically Signed   By: Ree Molt M.D.   On: 07/21/2024 12:36   CT ABDOMEN PELVIS W CONTRAST Result Date: 07/21/2024 CLINICAL DATA:  Abdominal pain. EXAM: CT ABDOMEN AND PELVIS WITH CONTRAST TECHNIQUE: Multidetector CT imaging of the abdomen and pelvis was performed using the standard protocol following bolus administration of intravenous contrast. RADIATION DOSE REDUCTION: This exam was performed according to the departmental dose-optimization program which includes automated exposure control, adjustment of the mA and/or kV according to patient size and/or use of iterative reconstruction technique. CONTRAST:  OMNIPAQUE  IOHEXOL  300 MG/ML  SOLN COMPARISON:  None Available. FINDINGS: Lower chest: Heart is normal size.  Lung bases are clear. Hepatobiliary: Liver, gallbladder and  biliary tree are normal. Pancreas: Normal. Spleen: Normal. Adrenals/Urinary Tract: Adrenal glands are normal. Kidneys are normal in size without hydronephrosis or nephrolithiasis. Ureters and bladder are normal. Stomach/Bowel: Stomach is normal. Small bowel is normal in  caliber and demonstrates minimal wall thickening over several jejunal loops in the left abdomen which is nonspecific. No adjacent free fluid or inflammatory change. Appendix is normal. Colon is normal. Vascular/Lymphatic: Abdominal aorta is normal in caliber. Remaining vascular structures are unremarkable. No significant adenopathy. Reproductive: Uterus normal size with mild prominence of the endometrium which is not well evaluated by CT. Ovaries are unremarkable. Other: No significant free fluid or focal inflammatory change. Musculoskeletal: No focal abnormality. IMPRESSION: 1. Minimal wall thickening over several jejunal loops in the left abdomen which is nonspecific and may be due to mild enteritis of infectious or inflammatory nature. No adjacent free fluid or inflammatory change. 2. Mild prominence of the endometrium which is not well evaluated by CT. Pelvic ultrasound would be helpful for further evaluation. Electronically Signed   By: Toribio Agreste M.D.   On: 07/21/2024 10:45   Assessment  43 y.o. female with a history of PCOS who presented to the ED 8/6 with worsening postprandial abdominal pain leading to vomiting with 2 prior ED visits earlier in the week.  GI consulted for further evaluation.  Intractable abdominal pain, N/V:  - Sudden onset this past Sunday 8/3 - CT A/P on 8/4 concerning for enteritis although non specific - CT A/P 8/6 without any bowel inflammation but edematous uterus/prominent follicles in both ovaries - Pelvic US  unremarkable. -GYN evaluation unremarkable, pelvic exam deferred.  STD urine testing pending. - Pain occurring consistently postprandially especially with solid food, no pain after cleared with diet yesterday at lunch but began 1.5-2 hours after solid food at dinner. - Leukocytosis with peak at 17, normalized yesterday but worsened to 11.9 today - Unable to collect stool studies given no diarrhea.  When she has bowel movement we will check fecal calprotectin  even if solid. - Given possible constipation component, starting MiraLAX  along with Senokot - Given postprandial nature to symptoms, 2-year course of Adderall, and some hypotension need to rule out mesenteric ischemia therefore CTA ordered for today - No prior celiac labs on file therefore also given nature of her symptoms will assess for this as well. - Will keep on clear liquid diet for now  Plan / Recommendations  Celiac panel CTA today Continue antiemetics.  Hold on endoscopic procedures for now Clear liquids Start miralax  17g daily and continue senna.     LOS: 1 day   07/25/2024, 10:15 AM  Charmaine Melia, MSN, FNP-BC, AGACNP-BC [facility]

## 2024-07-25 NOTE — Plan of Care (Signed)
   Problem: Coping: Goal: Level of anxiety will decrease Outcome: Progressing   Problem: Pain Managment: Goal: General experience of comfort will improve and/or be controlled Outcome: Progressing

## 2024-07-25 NOTE — Plan of Care (Signed)
  Problem: Education: Goal: Knowledge of General Education information will improve Description: Including pain rating scale, medication(s)/side effects and non-pharmacologic comfort measures Outcome: Adequate for Discharge   Problem: Clinical Measurements: Goal: Ability to maintain clinical measurements within normal limits will improve Outcome: Adequate for Discharge Goal: Diagnostic test results will improve Outcome: Adequate for Discharge

## 2024-07-25 NOTE — Progress Notes (Signed)
 Pts status remains unchanged. Contiues to experience constant nausea and vomiting with intense abdomen pain. Pt stated she feels she is not ready to be discharged home because the problem has not been resolved. Reassured pt I will voice her concern to the oncoming nurse to forward to the attending MD.

## 2024-07-25 NOTE — Progress Notes (Signed)
 PROGRESS NOTE    Patient: Ashley Soto                            PCP: Beverli Casa                    DOB: March 25, 1981            DOA: 07/23/2024 FMW:[id number]             DOS: 07/25/2024, 10:32 AM   LOS: 1 day   Date of Service: The patient was seen and examined on 07/25/2024  Subjective:   The patient was seen and examined this morning Very upset still complaining abdominal pain, nausea and vomiting unable to tolerate p.o..  Overnight had 2 episodes of vomiting  Husband present at bedside very upset that no diagnosis has been reached   Brief Narrative:   Ashley Soto is a 43 y.o. female with PMH anxiety, depression, PCOS, benzodiazepine withdrawal seizures who presents emerged part for evaluation of abdominal pain.  Patient was seen yesterday morning for similar complaints and diagnosed with enteritis with a negative CT and pelvic ultrasound.  States that symptoms worsened tonight and return to the emergency department for further evaluation.  Denies chest pain, shortness of breath, headache, fever or other systemic symptoms     Assessment & Plan:   Principal Problem:   Intractable abdominal pain Active Problems:   Anxiety state   History of substance abuse (HCC)   Primary insomnia   History of seizure   Generalized abdominal pain   Nausea and vomiting     Assessment and Plan: * Intractable abdominal pain with nausea vomiting - Etiology unclear -patient is very upset that no diagnosis has been reached - Persistent abdominal pain and vomiting  -CT abdomen/pelvis acute, nonspecific findings possible enteritis ureteral thickening -pelvic ultrasound-within normal limits  -Consulted and discussed the above findings with gastroenterology/and OB/GYN no acute findings or intervention were recommended at this point  - Continue IV fluids -Will make her n.p.o. again -Continue IV scheduled Reglan  -As needed IV analgesics  - S/p evaluation by OB/GYN-imaging reviewed, no  concerns or intervention recommended    Anxiety state - Home medication of Celexa -As needed Xanax   History of substance abuse (HCC) h/o status post detoxification for Xanax  abuse, May 2008,  - Denies any recent use or abuse  - Urine drug screen positive for amphetamine, opioids (patient is on Adderall, and has been receiving Norco Konix for abdominal pain)  Primary insomnia Continue as needed trazodone   History of seizure Send episodes not on any medications History of seizures in the context of withdrawal of benzodiazepine -Monitor closely -Continue recent use of benzodiazepines    Nutritional status:  The patient's BMI is: Body mass index is 22.05 kg/m. I agree with the assessment and plan as outlined  ------------------------------------------------------------------------------------------------------------------------------------------------  DVT prophylaxis:  heparin  injection 5,000 Units Start: 07/23/24 2200 SCDs Start: 07/23/24 0715   Code Status:   Code Status: Full Code  Family Communication: Husband present at bedside, updated, not satisfied with the results very upset!   Admission status:   Status is: Observation The patient remains OBS appropriate and will d/c before 2 midnights.   Disposition: From  - home             Planning for discharge in 1-2 days   Procedures:   No admission procedures for hospital encounter.   Antimicrobials:  Anti-infectives (From admission, onward)  None        Medication:   busPIRone   15 mg Oral BID   dicyclomine   20 mg Oral BID   ferrous sulfate   325 mg Oral Q breakfast   FLUoxetine   40 mg Oral q morning   heparin   5,000 Units Subcutaneous Q8H   metoCLOPramide  (REGLAN ) injection  10 mg Intravenous Q8H   pantoprazole   40 mg Oral BID   sennosides  10 mL Oral Daily   sodium chloride  flush  3 mL Intravenous Q12H    acetaminophen  **OR** acetaminophen , ALPRAZolam , alum & mag hydroxide-simeth,  HYDROmorphone  (DILAUDID ) injection, ipratropium, levalbuterol , ondansetron  **OR** ondansetron  (ZOFRAN ) IV, oxyCODONE , traZODone    Objective:   Vitals:   07/24/24 2041 07/25/24 0150 07/25/24 0500 07/25/24 0541  BP: 126/67   (P) 118/62  Pulse: (!) 48 (!) 50  (!) (P) 51  Resp: 16   (P) 16  Temp: 98.9 F (37.2 C)   (P) 98.2 F (36.8 C)  TempSrc: Oral   (P) Oral  SpO2: 99%   (P) 99%  Weight:   60.1 kg   Height:        Intake/Output Summary (Last 24 hours) at 07/25/2024 1032 Last data filed at 07/25/2024 0914 Gross per 24 hour  Intake 840 ml  Output --  Net 840 ml   Filed Weights   07/23/24 1420 07/24/24 0429 07/25/24 0500  Weight: 60.4 kg 60.5 kg 60.1 kg     Physical examination:   General:  AAO x 3,  cooperative, no distress;   HEENT:  Normocephalic, PERRL, otherwise with in Normal limits   Neuro:  CNII-XII intact. , normal motor and sensation, reflexes intact   Lungs:   Clear to auscultation BL, Respirations unlabored,  No wheezes / crackles  Cardio:    S1/S2, RRR, No murmure, No Rubs or Gallops   Abdomen:  Soft, diffuse subjective tenderness,  bowel sounds active all four quadrants, no guarding or peritoneal signs.  Muscular  skeletal:  Limited exam -global generalized weaknesses - in bed, able to move all 4 extremities,   2+ pulses,  symmetric, No pitting edema  Skin:  Dry, warm to touch, negative for any Rashes,  Wounds: Please see nursing documentation    -------------------------------------------------------------------------------------------------------------    LABs:     Latest Ref Rng & Units 07/25/2024    5:29 AM 07/24/2024    4:30 AM 07/23/2024    5:11 AM  CBC  WBC 4.0 - 10.5 K/uL 11.9  9.6  11.8   Hemoglobin 12.0 - 15.0 g/dL 87.3  88.5  85.3   Hematocrit 36.0 - 46.0 % 37.7  34.1  42.2   Platelets 150 - 400 K/uL 275  247  333       Latest Ref Rng & Units 07/25/2024    5:29 AM 07/24/2024    4:30 AM 07/23/2024    5:11 AM  CMP  Glucose 70 - 99 mg/dL 848   880  883   BUN 6 - 20 mg/dL 7  6  12    Creatinine 0.44 - 1.00 mg/dL 9.42  9.37  9.30   Sodium 135 - 145 mmol/L 135  135  137   Potassium 3.5 - 5.1 mmol/L 3.9  3.6  4.2   Chloride 98 - 111 mmol/L 102  109  101   CO2 22 - 32 mmol/L 26  22  25    Calcium 8.9 - 10.3 mg/dL 8.3  8.0  9.0   Total Protein 6.5 - 8.1 g/dL  7.0   Total Bilirubin 0.0 - 1.2 mg/dL   1.2   Alkaline Phos 38 - 126 U/L   51   AST 15 - 41 U/L   17   ALT 0 - 44 U/L   17        Micro Results No results found for this or any previous visit (from the past 240 hours).  Radiology Reports No results found.  SIGNED: Adriana DELENA Grams, MD, FHM. FAAFP. Jol[facility] Pack - [facility] Time spent - 55 min.  In seeing, evaluating and examining the patient. Reviewing medical records, labs, drawn plan of care. [facility],  Pager (please use [email or web address] to page/ text) Please use Epic Secure Chat for non-urgent communication (7AM-7PM)  If 7PM-7AM, please contact night-coverage [email or web address], 07/25/2024, 10:32 AM

## 2024-07-26 ENCOUNTER — Ambulatory Visit: Payer: Self-pay | Admitting: Obstetrics & Gynecology

## 2024-07-26 ENCOUNTER — Inpatient Hospital Stay (HOSPITAL_COMMUNITY)

## 2024-07-26 DIAGNOSIS — R110 Nausea: Secondary | ICD-10-CM

## 2024-07-26 DIAGNOSIS — R7401 Elevation of levels of liver transaminase levels: Secondary | ICD-10-CM

## 2024-07-26 DIAGNOSIS — K810 Acute cholecystitis: Secondary | ICD-10-CM | POA: Diagnosis not present

## 2024-07-26 DIAGNOSIS — R109 Unspecified abdominal pain: Secondary | ICD-10-CM | POA: Diagnosis not present

## 2024-07-26 DIAGNOSIS — R112 Nausea with vomiting, unspecified: Secondary | ICD-10-CM | POA: Diagnosis not present

## 2024-07-26 LAB — HEPATIC FUNCTION PANEL
ALT: 88 U/L — ABNORMAL HIGH (ref 0–44)
AST: 46 U/L — ABNORMAL HIGH (ref 15–41)
Albumin: 3.1 g/dL — ABNORMAL LOW (ref 3.5–5.0)
Alkaline Phosphatase: 40 U/L (ref 38–126)
Bilirubin, Direct: 0.1 mg/dL (ref 0.0–0.2)
Indirect Bilirubin: 0.1 mg/dL — ABNORMAL LOW (ref 0.3–0.9)
Total Bilirubin: 0.2 mg/dL (ref 0.0–1.2)
Total Protein: 5.6 g/dL — ABNORMAL LOW (ref 6.5–8.1)

## 2024-07-26 LAB — HEPATITIS PANEL, ACUTE
HCV Ab: NONREACTIVE
Hep A IgM: NONREACTIVE
Hep B C IgM: NONREACTIVE
Hepatitis B Surface Ag: NONREACTIVE

## 2024-07-26 LAB — BASIC METABOLIC PANEL WITH GFR
Anion gap: 6 (ref 5–15)
BUN: <5 mg/dL — ABNORMAL LOW (ref 6–20)
CO2: 27 mmol/L (ref 22–32)
Calcium: 8.3 mg/dL — ABNORMAL LOW (ref 8.9–10.3)
Chloride: 105 mmol/L (ref 98–111)
Creatinine, Ser: 0.68 mg/dL (ref 0.44–1.00)
GFR, Estimated: >60 mL/min (ref >60)
Glucose, Bld: 123 mg/dL — ABNORMAL HIGH (ref 70–99)
Potassium: 3.4 mmol/L — ABNORMAL LOW (ref 3.5–5.1)
Sodium: 138 mmol/L (ref 135–145)

## 2024-07-26 LAB — CBC
HCT: 35.6 % — ABNORMAL LOW (ref 36.0–46.0)
Hemoglobin: 12.3 g/dL (ref 12.0–15.0)
MCH: 31.4 pg (ref 26.0–34.0)
MCHC: 34.6 g/dL (ref 30.0–36.0)
MCV: 90.8 fL (ref 80.0–100.0)
Platelets: 265 K/uL (ref 150–400)
RBC: 3.92 MIL/uL (ref 3.87–5.11)
RDW: 12.7 % (ref 11.5–15.5)
WBC: 8.6 K/uL (ref 4.0–10.5)
nRBC: 0 % (ref 0.0–0.2)

## 2024-07-26 LAB — GLUCOSE, CAPILLARY: Glucose-Capillary: 121 mg/dL — ABNORMAL HIGH (ref 70–99)

## 2024-07-26 MED ORDER — DEXTROSE-SODIUM CHLORIDE 5-0.45 % IV SOLN: INTRAVENOUS | Status: DC

## 2024-07-26 MED ORDER — CEFAZOLIN SODIUM-DEXTROSE 2-4 GM/100ML-% IV SOLN
2.0000 g | INTRAVENOUS | Status: AC
  Administered 2024-07-27: 2 g via INTRAVENOUS

## 2024-07-26 MED ORDER — INDOCYANINE GREEN 25 MG IV SOLR
2.5000 mg | Freq: Once | INTRAVENOUS | Status: DC
  Filled 2024-07-26: qty 10

## 2024-07-26 MED ORDER — CHLORHEXIDINE GLUCONATE CLOTH 2 % EX PADS
6.0000 | MEDICATED_PAD | Freq: Once | CUTANEOUS | Status: AC
  Administered 2024-07-26: 6 via TOPICAL

## 2024-07-26 MED ORDER — CHLORHEXIDINE GLUCONATE CLOTH 2 % EX PADS: 6.0000 | MEDICATED_PAD | Freq: Once | CUTANEOUS | Status: DC

## 2024-07-26 NOTE — Plan of Care (Signed)

## 2024-07-26 NOTE — Progress Notes (Signed)
 Some right-sided abdominal pain and nausea after clear liquid breakfast.  I note a relative bump in her aminotransferases this morning.  Ultrasound done this morning.  Results not back yet.  I called radiology and asked them to expedite.  Dr. Evonnie in to see patient.  Await ultrasound results so further recommendations regarding management can be made.

## 2024-07-26 NOTE — Progress Notes (Signed)
 PROGRESS NOTE    Patient: Ashley Soto                            PCP: Beverli Casa                    DOB: 11-Jan-1981            DOA: 07/23/2024 FMW:[id number]             DOS: 07/26/2024, 12:52 PM   LOS: 2 days   Date of Service: The patient was seen and examined on 07/26/2024  Subjective:   The patient was seen and examined this morning... Reporting improved abdominal pain.  But she has not had much oral intake  Yesterday with any oral intake abdominal pain exacerbated followed by nausea and vomiting  CT angiogram discussed with patient for possible cholecystitis Ultrasound of the abdomen completed pending results  Discussed with patient regarding general surgery evaluation for her gallbladder She is agreeable  Brief Narrative:   Emmanuel Ercole is a 43 y.o. female with PMH anxiety, depression, PCOS, benzodiazepine withdrawal seizures who presents emerged part for evaluation of abdominal pain.  Patient was seen yesterday morning for similar complaints and diagnosed with enteritis with a negative CT and pelvic ultrasound.  States that symptoms worsened tonight and return to the emergency department for further evaluation.  Denies chest pain, shortness of breath, headache, fever or other systemic symptoms     Assessment & Plan:   Principal Problem:   Intractable abdominal pain Active Problems:   Anxiety state   History of substance abuse (HCC)   Primary insomnia   History of seizure   Generalized abdominal pain   Nausea and vomiting     Assessment and Plan: * Intractable abdominal pain with nausea vomiting -Possible cholecystitis versus cholelithiasis -CT angio.  Reviewed Findings are concerning for acute Cholecystitis - Right upper quadrant ultrasound: Gallbladder wall thickening with wall edema and trace pericholecystic fluid,   -Mildly elevated LFTs although T. bili at 0.2  General surgery consulted-appreciate further evaluation recommendation   - Persistent  abdominal pain and vomiting -continue antiemetics, as needed analgesics  -CT abdomen/pelvis acute, nonspecific findings possible enteritis ureteral thickening -pelvic ultrasound-within normal limits  -Consulted and discussed the above findings with gastroenterology/and OB/GYN no acute findings or intervention were recommended at this point  - Continue IV fluids -Will make her n.p.o. again -Continue IV scheduled Reglan  -As needed IV analgesics     Anxiety state - Home medication of Celexa -As needed Xanax   History of substance abuse (HCC) h/o status post detoxification for Xanax  abuse, May 2008,  - Denies any recent use or abuse  - Urine drug screen positive for amphetamine, opioids (patient is on Adderall, and has been receiving Norco Konix for abdominal pain)  Primary insomnia Continue as needed trazodone   History of seizure Send episodes not on any medications History of seizures in the context of withdrawal of benzodiazepine -Monitor closely -Continue recent use of benzodiazepines    Nutritional status:  The patient's BMI is: Body mass index is 22.12 kg/m. I agree with the assessment and plan as outlined  ------------------------------------------------------------------------------------------------------------------------------------------------  DVT prophylaxis:  heparin  injection 5,000 Units Start: 07/23/24 2200 SCDs Start: 07/23/24 0715   Code Status:   Code Status: Full Code  Family Communication: Husband present at bedside, updated, not satisfied with the results very upset!   Admission status:   Status is: Observation The patient remains OBS  appropriate and will d/c before 2 midnights.   Disposition: From  - home             Planning for discharge in 1-2 days   Procedures:   No admission procedures for hospital encounter.   Antimicrobials:  Anti-infectives (From admission, onward)    None        Medication:   busPIRone   15 mg Oral  BID   dicyclomine   20 mg Oral BID   ferrous sulfate   325 mg Oral Q breakfast   FLUoxetine   40 mg Oral q morning   heparin   5,000 Units Subcutaneous Q8H   metoCLOPramide  (REGLAN ) injection  10 mg Intravenous Q8H   pantoprazole   40 mg Oral BID   polyethylene glycol  17 g Oral Daily   sennosides  10 mL Oral Daily   sodium chloride  flush  3 mL Intravenous Q12H    acetaminophen  **OR** acetaminophen , ALPRAZolam , alum & mag hydroxide-simeth, HYDROmorphone  (DILAUDID ) injection, ipratropium, levalbuterol , ondansetron  **OR** ondansetron  (ZOFRAN ) IV, mouth rinse, oxyCODONE , traZODone    Objective:   Vitals:   07/25/24 0541 07/25/24 1300 07/25/24 2056 07/26/24 0443  BP: (P) 118/62 (!) 110/59 (!) 97/47 111/66  Pulse: (!) (P) 51 62 66 71  Resp: (P) 16 17 20 20   Temp: (P) 98.2 F (36.8 C) 97.9 F (36.6 C) 98.4 F (36.9 C) 98.2 F (36.8 C)  TempSrc: (P) Oral Oral Oral Oral  SpO2: (P) 99% 98% 95% 96%  Weight:    60.3 kg  Height:        Intake/Output Summary (Last 24 hours) at 07/26/2024 1252 Last data filed at 07/26/2024 0900 Gross per 24 hour  Intake 439.05 ml  Output --  Net 439.05 ml   Filed Weights   07/24/24 0429 07/25/24 0500 07/26/24 0443  Weight: 60.5 kg 60.1 kg 60.3 kg     Physical examination:     General:  AAO x 3,  cooperative, no distress;   HEENT:  Normocephalic, PERRL, otherwise with in Normal limits   Neuro:  CNII-XII intact. , normal motor and sensation, reflexes intact   Lungs:   Clear to auscultation BL, Respirations unlabored,  No wheezes / crackles  Cardio:    S1/S2, RRR, No murmure, No Rubs or Gallops   Abdomen:  Soft, generalized diffuse tenderness with deep palpation More in right upper quadrant area , bowel sounds active all four quadrants, no guarding or peritoneal signs.  Muscular  skeletal:  Limited exam -global generalized weaknesses - in bed, able to move all 4 extremities,   2+ pulses,  symmetric, No pitting edema  Skin:  Dry, warm to touch,  negative for any Rashes,  Wounds: Please see nursing documentation     -------------------------------------------------------------------------------------------------------------    LABs:     Latest Ref Rng & Units 07/26/2024    4:53 AM 07/25/2024    5:29 AM 07/24/2024    4:30 AM  CBC  WBC 4.0 - 10.5 K/uL 8.6  11.9  9.6   Hemoglobin 12.0 - 15.0 g/dL 87.6  87.3  88.5   Hematocrit 36.0 - 46.0 % 35.6  37.7  34.1   Platelets 150 - 400 K/uL 265  275  247       Latest Ref Rng & Units 07/26/2024    5:41 AM 07/26/2024    4:53 AM 07/25/2024    5:29 AM  CMP  Glucose 70 - 99 mg/dL  876  848   BUN 6 - 20 mg/dL  <5  7  Creatinine 0.44 - 1.00 mg/dL  9.31  9.42   Sodium 864 - 145 mmol/L  138  135   Potassium 3.5 - 5.1 mmol/L  3.4  3.9   Chloride 98 - 111 mmol/L  105  102   CO2 22 - 32 mmol/L  27  26   Calcium 8.9 - 10.3 mg/dL  8.3  8.3   Total Protein 6.5 - 8.1 g/dL 5.6     Total Bilirubin 0.0 - 1.2 mg/dL 0.2     Alkaline Phos 38 - 126 U/L 40     AST 15 - 41 U/L 46     ALT 0 - 44 U/L 88          Micro Results No results found for this or any previous visit (from the past 240 hours).  Radiology Reports US  Abdomen Limited RUQ (LIVER/GB) Result Date: 07/26/2024 CLINICAL DATA:  [id number] Right-sided abdominal pain of unknown cause [id number] EXAM: ULTRASOUND ABDOMEN LIMITED RIGHT UPPER QUADRANT COMPARISON:  July 25, 2024 FINDINGS: Gallbladder: No gallstones. Moderate gallbladder wall thickening measuring 1 cm with gallbladder wall edema. Trace pericholecystic fluid. No sonographic Murphy's sign noted by sonographer. Common bile duct: Diameter: 4 mm Liver: Normal echogenicity. No focal lesion identified. No intrahepatic biliary ductal dilation. Portal vein is patent on color Doppler imaging with normal direction of blood flow towards the liver. Other: None. IMPRESSION: Gallbladder wall thickening with wall edema and trace pericholecystic fluid, which in the absence of stones and a sonographic Murphy's  sign, may be due to acute hepatitis, upper abdominal inflammation, or volume overload possibly from CHF or renal failure. Laboratory correlation recommended. Electronically Signed   By: Rogelia Myers M.D.   On: 07/26/2024 12:20    SIGNED: Adriana DELENA Grams, MD, FHM. FAAFP. [facility]ol[facility] Pack - [facility] Time spent - 55 min.  In seeing, evaluating and examining the patient. Reviewing medical records, labs, drawn plan of care. [facility],  Pager (please use [email or web address] to page/ text) Please use Epic Secure Chat for non-urgent communication (7AM-7PM)  If 7PM-7AM, please contact night-coverage [email or web address], 07/26/2024, 12:52 PM

## 2024-07-26 NOTE — Consult Note (Signed)
 [facility] Consult  Reason for Consult: Concern for cholecystitis Referring Physician: Dr. Willette  Chief Complaint   Abdominal Pain     HPI: Ashley Soto is a 43 y.o. female who was admitted to the hospital with intractable postprandial abdominal pain, nausea, and vomiting.  Patient states that starting on Sunday, she had severe lower abdominal pain that started after eating dinner.  Her pain remained stable throughout the night, which prompted her to present to the emergency department on Monday morning, 8/4.  Her workup showed possible mild enteritis of the jejunum with a mild leukocytosis, and she was discharged home with a prescription for Augmentin  and Zofran .  She was reevaluated in the emergency department on 8/5, and her leukocytosis had slightly improved.  Her symptoms were felt to be still be related to her enteritis, and she was discharged.  She returned to the emergency department again on 8/6 for persistent abdominal pain.  Repeat CT of the abdomen pelvis was obtained which demonstrated possible edematous changes of the uterus and prominent follicles in both ovaries with minimal free fluid.  Decision was made to admit the patient secondary to intractable nausea, vomiting, and abdominal pain.  Since being in the hospital, she was evaluated by GI and gynecology.  Gynecology had no significant recommendations.  GI was concern for possible mesenteric ischemia, and obtained a CTA.  This imaging study demonstrated new fluid around the gallbladder, suggestive for wall thickening and pericholecystic fluid, concerning for acute cholecystitis.  Abdominal ultrasound was subsequently ordered.  Her past medical history is significant for anxiety, depression, and previous substance abuse.  Her surgical history is significant for cesarean section.  This morning, the patient continues to have epigastric/right upper quadrant abdominal pain.  She has been able to tolerate a little bit of  her clears, but she still gets severe pain whenever she consumes anything.  Past Medical History:  Diagnosis Date   Anxiety and depression    History of seizures     D/T  BENZODIAZEPINE WITHDRAWAL. only had one seizure   History of substance abuse (HCC)    ALCOHOL AND DRUG FREE FOR 4 YEARS.  INVOLVED WITH AA   Polycystic disease, ovaries    Tinea corporis    Tobacco abuse     Past Surgical History:  Procedure Laterality Date   DILATION AND CURETTAGE OF UTERUS     DILATION AND CURETTAGE OF UTERUS N/A 03/19/2016   Procedure: SUCTION DILATATION AND CURETTAGE;  Surgeon: Bebe Furry, MD;  Location: [facility] ORS;  Service: Gynecology;  Laterality: N/A;   DORSAL COMPARTMENT RELEASE Left 12/28/2020   Procedure: Everitt Curt release, left;  Surgeon: Kathlynn Sharper, MD;  Location: [facility] OR[facility];  Service: Orthopedics;  Laterality: Left;   TONSILLECTOMY      Family History  Problem Relation Age of Onset   Breast cancer Mother        early 64s   Breast cancer Paternal Grandmother        early 67s    Social History   Tobacco Use   Smoking status: Every Day    Current packs/day: 0.50    Types: Cigarettes   Smokeless tobacco: Never  Vaping Use   Vaping status: Never Used  Substance Use Topics   Alcohol use: Not Currently    Comment: ALCOHOL FREE X 4 YEARS   Drug use: No    Comment: DRUG FREE X 4 YEARS    Medications: I have reviewed the patient's current medications.  Allergies  Allergen  Reactions   Paroxetine Hcl     Suicidal thoughts    Sulfa Antibiotics Hives     ROS:  Pertinent items are noted in HPI.  Blood pressure 111/66, pulse 71, temperature 98.2 F (36.8 C), temperature source Oral, resp. rate 20, height 5' 5 (1.651 m), weight 60.3 kg, SpO2 96%. Physical Exam Vitals reviewed.  Constitutional:      Appearance: She is well-developed.  HENT:     Head: Normocephalic and atraumatic.  Eyes:     Extraocular Movements: Extraocular movements intact.     Pupils:  Pupils are equal, round, and reactive to light.  Cardiovascular:     Rate and Rhythm: Normal rate.  Pulmonary:     Effort: Pulmonary effort is normal.  Abdominal:     Comments: Abdomen soft, nondistended, no percussion tenderness, mild epigastric/right upper quadrant tenderness to palpation; no rigidity, guarding, rebound tenderness; negative Murphy sign  Skin:    General: Skin is warm and dry.  Neurological:     General: No focal deficit present.     Mental Status: She is alert and oriented to person, place, and time.  Psychiatric:        Mood and Affect: Mood normal.        Behavior: Behavior normal.     Results: Results for orders placed or performed during the hospital encounter of 07/23/24 (from the past 48 hours)  Basic metabolic panel     Status: Abnormal   Collection Time: 07/25/24  5:29 AM  Result Value Ref Range   Sodium 135 135 - 145 mmol/L   Potassium 3.9 3.5 - 5.1 mmol/L   Chloride 102 98 - 111 mmol/L   CO2 26 22 - 32 mmol/L   Glucose, Bld 151 (H) 70 - 99 mg/dL    Comment: Glucose reference range applies only to samples taken after fasting for at least 8 hours.   BUN 7 6 - 20 mg/dL   Creatinine, Ser 9.42 0.44 - 1.00 mg/dL   Calcium 8.3 (L) 8.9 - 10.3 mg/dL   GFR, Estimated >39 >39 mL/min    Comment: (NOTE) Calculated using the CKD-EPI Creatinine Equation (2021)    Anion gap 7 5 - 15    Comment: Performed at [facility], [address] [place], [place] [address]  CBC     Status: Abnormal   Collection Time: 07/25/24  5:29 AM  Result Value Ref Range   WBC 11.9 (H) 4.0 - 10.5 K/uL   RBC 4.14 3.87 - 5.11 MIL/uL   Hemoglobin 12.6 12.0 - 15.0 g/dL   HCT 62.2 63.9 - 53.9 %   MCV 91.1 80.0 - 100.0 fL   MCH 30.4 26.0 - 34.0 pg   MCHC 33.4 30.0 - 36.0 g/dL   RDW 87.5 88.4 - 84.4 %   Platelets 275 150 - 400 K/uL   nRBC 0.0 0.0 - 0.2 %    Comment: Performed at [facility], [address] [place], [place] [address]  Glucose, capillary     Status: Abnormal    Collection Time: 07/25/24  7:23 AM  Result Value Ref Range   Glucose-Capillary 154 (H) 70 - 99 mg/dL    Comment: Glucose reference range applies only to samples taken after fasting for at least 8 hours.  Basic metabolic panel     Status: Abnormal   Collection Time: 07/26/24  4:53 AM  Result Value Ref Range   Sodium 138 135 - 145 mmol/L   Potassium 3.4 (L) 3.5 - 5.1  mmol/L   Chloride 105 98 - 111 mmol/L   CO2 27 22 - 32 mmol/L   Glucose, Bld 123 (H) 70 - 99 mg/dL    Comment: Glucose reference range applies only to samples taken after fasting for at least 8 hours.   BUN <5 (L) 6 - 20 mg/dL   Creatinine, Ser 9.31 0.44 - 1.00 mg/dL   Calcium 8.3 (L) 8.9 - 10.3 mg/dL   GFR, Estimated >39 >39 mL/min    Comment: (NOTE) Calculated using the CKD-EPI Creatinine Equation (2021)    Anion gap 6 5 - 15    Comment: Performed at [facility], [address], [place], [place] [address]  CBC     Status: Abnormal   Collection Time: 07/26/24  4:53 AM  Result Value Ref Range   WBC 8.6 4.0 - 10.5 K/uL   RBC 3.92 3.87 - 5.11 MIL/uL   Hemoglobin 12.3 12.0 - 15.0 g/dL   HCT 64.3 (L) 63.9 - 53.9 %   MCV 90.8 80.0 - 100.0 fL   MCH 31.4 26.0 - 34.0 pg   MCHC 34.6 30.0 - 36.0 g/dL   RDW 87.2 88.4 - 84.4 %   Platelets 265 150 - 400 K/uL   nRBC 0.0 0.0 - 0.2 %    Comment: Performed at [facility], [address], [place], [place] [address]  Hepatic function panel     Status: Abnormal   Collection Time: 07/26/24  5:41 AM  Result Value Ref Range   Total Protein 5.6 (L) 6.5 - 8.1 g/dL   Albumin 3.1 (L) 3.5 - 5.0 g/dL   AST 46 (H) 15 - 41 U/L   ALT 88 (H) 0 - 44 U/L   Alkaline Phosphatase 40 38 - 126 U/L   Total Bilirubin 0.2 0.0 - 1.2 mg/dL   Bilirubin, Direct 0.1 0.0 - 0.2 mg/dL   Indirect Bilirubin 0.1 (L) 0.3 - 0.9 mg/dL    Comment: Performed at [facility], [address], [place], [place] [address]  Glucose, capillary     Status: Abnormal   Collection Time: 07/26/24  7:41 AM  Result Value Ref  Range   Glucose-Capillary 121 (H) 70 - 99 mg/dL    Comment: Glucose reference range applies only to samples taken after fasting for at least 8 hours.    US  Abdomen Limited RUQ (LIVER/GB) Result Date: 07/26/2024 CLINICAL DATA:  [id number] Right-sided abdominal pain of unknown cause [id number] EXAM: ULTRASOUND ABDOMEN LIMITED RIGHT UPPER QUADRANT COMPARISON:  July 25, 2024 FINDINGS: Gallbladder: No gallstones. Moderate gallbladder wall thickening measuring 1 cm with gallbladder wall edema. Trace pericholecystic fluid. No sonographic Murphy's sign noted by sonographer. Common bile duct: Diameter: 4 mm Liver: Normal echogenicity. No focal lesion identified. No intrahepatic biliary ductal dilation. Portal vein is patent on color Doppler imaging with normal direction of blood flow towards the liver. Other: None. IMPRESSION: Gallbladder wall thickening with wall edema and trace pericholecystic fluid, which in the absence of stones and a sonographic Murphy's sign, may be due to acute hepatitis, upper abdominal inflammation, or volume overload possibly from CHF or renal failure. Laboratory correlation recommended. Electronically Signed   By: Rogelia Myers M.D.   On: 07/26/2024 12:20   CT Angio Abd/Pel w/ and/or w/o Result Date: 07/25/2024 CLINICAL DATA:  Postprandial lower abdominal pain. Rule out mesenteric ischemia. EXAM: CTA ABDOMEN AND PELVIS WITHOUT AND WITH CONTRAST TECHNIQUE: Multidetector CT imaging of the abdomen and pelvis was performed using the standard protocol during bolus administration of intravenous  contrast. Multiplanar reconstructed images and MIPs were obtained and reviewed to evaluate the vascular anatomy. RADIATION DOSE REDUCTION: This exam was performed according to the departmental dose-optimization program which includes automated exposure control, adjustment of the mA and/or kV according to patient size and/or use of iterative reconstruction technique. CONTRAST:  80mL OMNIPAQUE  IOHEXOL  350 MG/ML  SOLN COMPARISON:  CT abdomen pelvis 07/23/2024 and 07/21/2024 FINDINGS: VASCULAR Aorta: Normal caliber aorta without aneurysm, dissection, vasculitis or significant stenosis. Celiac: Patent without evidence of aneurysm, dissection, vasculitis or significant stenosis. SMA: Patent without evidence of aneurysm, dissection, vasculitis or significant stenosis. Renals: Bilateral renal arteries are widely patent without aneurysm or dissection. Single right renal artery. Two left renal arteries. IMA: Patent without evidence of aneurysm, dissection, vasculitis or significant stenosis. Inflow: Patent without evidence of aneurysm, dissection, vasculitis or significant stenosis. Proximal Outflow: Proximal femoral arteries are widely patent. Veins: Portal venous system is patent. No gross abnormality to the IVC or renal veins. No gross abnormality to the iliac veins. Review of the MIP images confirms the above findings. NON-VASCULAR Lower chest: Subtle pleural-based densities in posterior left lower lobe on image 16/6 are new from the recent comparison examination and suspect this is related to atelectasis. No pleural effusions. Hepatobiliary: Diffuse gallbladder wall thickening and cannot exclude pericholecystic fluid. This finding is new from the recent comparison examinations. Limited evaluation for gallstones. Findings are concerning for acute cholecystitis. Small amount of periportal edema. No significant biliary dilatation. Pancreas: Unremarkable. No pancreatic ductal dilatation or surrounding inflammatory changes. Spleen: Normal in size without focal abnormality. Adrenals/Urinary Tract: Normal appearance of the adrenal glands. Normal appearance of both kidneys without hydronephrosis. No suspicious renal lesions. Urinary bladder is decompressed. Stomach/Bowel: No bowel dilatation. No focal bowel inflammation. Normal appearance of the stomach. Normal appendix. Lymphatic: No significant lymph node enlargement in the abdomen  or pelvis. Reproductive: Limited evaluation of the uterus and adnexal structures but no gross abnormality or significant change since the recent comparison examinations. Other: Small to moderate amount of free fluid in the pelvis. There is fluid the cul-de-sac and fluid anterior to the uterine fundus. Pelvic free fluid has increased from the previous examination. Musculoskeletal: No acute bone abnormality. IMPRESSION: VASCULAR 1. Arterial structures in the abdomen and pelvis are widely patent. No evidence for mesenteric ischemia. NON-VASCULAR 1. New fluid around the gallbladder is suggestive for wall thickening and pericholecystic fluid. There is also increased free fluid in the pelvis. Findings are concerning for acute cholecystitis. Consider further characterization with right upper quadrant ultrasound for better characterization. Electronically Signed   By: Juliene Balder M.D.   On: 07/25/2024 16:12     Assessment & Plan:  Ashley Soto is a 43 y.o. female who was admitted to the hospital with intractable abdominal pain, nausea, and vomiting.  Imaging and blood work evaluated by myself.  -Her abdominal ultrasound is demonstrating pericholecystic fluid and wall thickening, concerning for cholecystitis.  She has negative Murphy's, and no gallstones present, so possibility of hepatitis cannot be ruled out on this imaging study.  I discussed this with Dr. Shaaron, who feels that this is more likely cholecystitis rather than a hepatitis picture.  No need for liver biopsy at the time of surgery per Dr. Shaaron -We discussed the pathophysiology of gallbladder disease, and recommendations for surgical intervention -I counseled the patient about the indication, risks and benefits of robotic assisted laparoscopic cholecystectomy.  She understands there is a very small chance for bleeding, infection, injury to normal structures (including common bile duct),  conversion to open surgery, persistent symptoms, evolution of  postcholecystectomy diarrhea, need for secondary interventions, anesthesia reaction, cardiopulmonary issues and other risks not specifically detailed here. I described the expected recovery, the plan for follow-up and the restrictions during the recovery phase.  All questions were answered. -Plan for cholecystectomy tomorrow morning -Clear liquid diet today, NPO at midnight -IV Ancef  ordered given concern for cholecystitis -IV fluids per primary team -Appreciate GI recommendations.  Acute hepatitis panel ordered -Appreciate hospitalist recommendations  All questions were answered to the satisfaction of the patient and family.  Note: Portions of this report may have been transcribed using voice recognition software. Every effort has been made to ensure accuracy; however, inadvertent computerized transcription errors may still be present.   -- Dorothyann Brittle, DO [facility] [address] [place], [place] [address] [phone] (office)

## 2024-07-27 ENCOUNTER — Encounter (HOSPITAL_COMMUNITY)
Admission: EM | Disposition: A | Discharge status: Home / Self Care | Payer: Self-pay | Source: Home / Self Care | Attending: Family Medicine

## 2024-07-27 ENCOUNTER — Encounter (HOSPITAL_COMMUNITY): Payer: Self-pay | Admitting: Family Medicine

## 2024-07-27 ENCOUNTER — Inpatient Hospital Stay (HOSPITAL_COMMUNITY)

## 2024-07-27 DIAGNOSIS — R112 Nausea with vomiting, unspecified: Secondary | ICD-10-CM | POA: Diagnosis not present

## 2024-07-27 DIAGNOSIS — K810 Acute cholecystitis: Secondary | ICD-10-CM | POA: Diagnosis not present

## 2024-07-27 DIAGNOSIS — R109 Unspecified abdominal pain: Secondary | ICD-10-CM | POA: Diagnosis not present

## 2024-07-27 LAB — COMPREHENSIVE METABOLIC PANEL WITH GFR
ALT: 69 U/L — ABNORMAL HIGH (ref 0–44)
AST: 24 U/L (ref 15–41)
Albumin: 2.9 g/dL — ABNORMAL LOW (ref 3.5–5.0)
Alkaline Phosphatase: 36 U/L — ABNORMAL LOW (ref 38–126)
Anion gap: 5 (ref 5–15)
BUN: <5 mg/dL — ABNORMAL LOW (ref 6–20)
CO2: 28 mmol/L (ref 22–32)
Calcium: 8.2 mg/dL — ABNORMAL LOW (ref 8.9–10.3)
Chloride: 103 mmol/L (ref 98–111)
Creatinine, Ser: 0.7 mg/dL (ref 0.44–1.00)
GFR, Estimated: >60 mL/min (ref >60)
Glucose, Bld: 108 mg/dL — ABNORMAL HIGH (ref 70–99)
Potassium: 3.7 mmol/L (ref 3.5–5.1)
Sodium: 136 mmol/L (ref 135–145)
Total Bilirubin: 0.6 mg/dL (ref 0.0–1.2)
Total Protein: 5.3 g/dL — ABNORMAL LOW (ref 6.5–8.1)

## 2024-07-27 LAB — GLUCOSE, CAPILLARY: Glucose-Capillary: 113 mg/dL — ABNORMAL HIGH (ref 70–99)

## 2024-07-27 SURGERY — CHOLECYSTECTOMY, ROBOT-ASSISTED, LAPAROSCOPIC
Anesthesia: General | Site: Abdomen

## 2024-07-27 MED ORDER — DEXTROSE-SODIUM CHLORIDE 5-0.45 % IV SOLN: INTRAVENOUS | Status: AC

## 2024-07-27 MED ORDER — OXYCODONE HCL 5 MG PO TABS: 5.0000 mg | ORAL_TABLET | Freq: Once | ORAL | Status: DC | PRN

## 2024-07-27 MED ORDER — SUCCINYLCHOLINE CHLORIDE 200 MG/10ML IV SOSY
PREFILLED_SYRINGE | INTRAVENOUS | Status: AC
  Filled 2024-07-27: qty 10

## 2024-07-27 MED ORDER — PROPOFOL 10 MG/ML IV BOLUS
INTRAVENOUS | Status: AC
  Filled 2024-07-27: qty 20

## 2024-07-27 MED ORDER — KETOROLAC TROMETHAMINE 30 MG/ML IJ SOLN
INTRAMUSCULAR | Status: DC | PRN
  Administered 2024-07-27: 30 mg via INTRAVENOUS

## 2024-07-27 MED ORDER — SCOPOLAMINE 1 MG/3DAYS TD PT72: 1.0000 | MEDICATED_PATCH | Freq: Once | TRANSDERMAL | Status: DC

## 2024-07-27 MED ORDER — STERILE WATER FOR IRRIGATION IR SOLN
Status: DC | PRN
  Administered 2024-07-27: 500 mL

## 2024-07-27 MED ORDER — OXYCODONE HCL 5 MG PO TABS
5.0000 mg | ORAL_TABLET | Freq: Four times a day (QID) | ORAL | Status: DC | PRN
  Administered 2024-07-27 – 2024-07-28 (×3): 5 mg via ORAL
  Filled 2024-07-27 (×2): qty 1

## 2024-07-27 MED ORDER — METOCLOPRAMIDE HCL 5 MG/ML IJ SOLN
10.0000 mg | Freq: Three times a day (TID) | INTRAMUSCULAR | Status: DC
  Administered 2024-07-27 – 2024-07-28 (×4): 10 mg via INTRAVENOUS
  Filled 2024-07-27 (×3): qty 2

## 2024-07-27 MED ORDER — KETOROLAC TROMETHAMINE 30 MG/ML IJ SOLN
INTRAMUSCULAR | Status: AC
  Filled 2024-07-27: qty 1

## 2024-07-27 MED ORDER — INDOCYANINE GREEN 25 MG IV SOLR
INTRAVENOUS | Status: AC
  Filled 2024-07-27: qty 10

## 2024-07-27 MED ORDER — OXYCODONE HCL 5 MG PO TABS: 5.0000 mg | ORAL_TABLET | Freq: Four times a day (QID) | ORAL | 0 refills | Status: AC | PRN

## 2024-07-27 MED ORDER — ACETAMINOPHEN 500 MG PO TABS: 1000.0000 mg | ORAL_TABLET | Freq: Four times a day (QID) | ORAL | 0 refills | Status: AC

## 2024-07-27 MED ORDER — ACETAMINOPHEN 500 MG PO TABS
1000.0000 mg | ORAL_TABLET | Freq: Four times a day (QID) | ORAL | Status: DC
  Administered 2024-07-27 – 2024-07-28 (×3): 1000 mg via ORAL
  Filled 2024-07-27 (×2): qty 2

## 2024-07-27 MED ORDER — ORAL CARE MOUTH RINSE: 15.0000 mL | Freq: Once | OROMUCOSAL | Status: DC

## 2024-07-27 MED ORDER — FENTANYL CITRATE (PF) 250 MCG/5ML IJ SOLN
INTRAMUSCULAR | Status: AC
  Filled 2024-07-27: qty 5

## 2024-07-27 MED ORDER — FENTANYL CITRATE (PF) 100 MCG/2ML IJ SOLN
INTRAMUSCULAR | Status: AC
  Filled 2024-07-27: qty 2

## 2024-07-27 MED ORDER — LACTATED RINGERS IV SOLN
INTRAVENOUS | Status: DC | PRN
Start: 2024-07-27 — End: 2024-07-27

## 2024-07-27 MED ORDER — MIDAZOLAM HCL 2 MG/2ML IJ SOLN
INTRAMUSCULAR | Status: DC | PRN
  Administered 2024-07-27: 2 mg via INTRAVENOUS

## 2024-07-27 MED ORDER — CHLORHEXIDINE GLUCONATE 0.12 % MT SOLN: 15.0000 mL | Freq: Once | OROMUCOSAL | Status: DC

## 2024-07-27 MED ORDER — FENTANYL CITRATE PF 50 MCG/ML IJ SOSY
25.0000 ug | PREFILLED_SYRINGE | INTRAMUSCULAR | Status: DC | PRN
  Administered 2024-07-27: 50 ug via INTRAVENOUS
  Filled 2024-07-27: qty 1

## 2024-07-27 MED ORDER — PROPOFOL 10 MG/ML IV BOLUS
INTRAVENOUS | Status: DC | PRN
Start: 2024-07-27 — End: 2024-07-27
  Administered 2024-07-27: 150 mg via INTRAVENOUS

## 2024-07-27 MED ORDER — DEXAMETHASONE SODIUM PHOSPHATE 10 MG/ML IJ SOLN: 8.0000 mg | Freq: Once | INTRAMUSCULAR | Status: DC | PRN

## 2024-07-27 MED ORDER — SUGAMMADEX SODIUM 200 MG/2ML IV SOLN
INTRAVENOUS | Status: DC | PRN
  Administered 2024-07-27: 200 mg via INTRAVENOUS

## 2024-07-27 MED ORDER — MIDAZOLAM HCL 2 MG/2ML IJ SOLN
INTRAMUSCULAR | Status: AC
  Filled 2024-07-27: qty 2

## 2024-07-27 MED ORDER — OXYCODONE HCL 5 MG/5ML PO SOLN: 5.0000 mg | Freq: Once | ORAL | Status: DC | PRN

## 2024-07-27 MED ORDER — ONDANSETRON HCL 4 MG/2ML IJ SOLN
INTRAMUSCULAR | Status: AC
  Filled 2024-07-27: qty 2

## 2024-07-27 MED ORDER — ROCURONIUM BROMIDE 10 MG/ML (PF) SYRINGE
PREFILLED_SYRINGE | INTRAVENOUS | Status: AC
  Filled 2024-07-27: qty 10

## 2024-07-27 MED ORDER — ROCURONIUM BROMIDE 100 MG/10ML IV SOLN
INTRAVENOUS | Status: DC | PRN
  Administered 2024-07-27: 40 mg via INTRAVENOUS

## 2024-07-27 MED ORDER — DOCUSATE SODIUM 100 MG PO CAPS
100.0000 mg | ORAL_CAPSULE | Freq: Two times a day (BID) | ORAL | 2 refills | Status: AC
Start: 2024-07-27 — End: 2025-07-27

## 2024-07-27 MED ORDER — SUCCINYLCHOLINE 20MG/ML (10ML) SYRINGE FOR MEDFUSION PUMP - OPTIME
INTRAMUSCULAR | Status: DC | PRN
  Administered 2024-07-27: 120 mg via INTRAVENOUS

## 2024-07-27 MED ORDER — LACTATED RINGERS IV SOLN: INTRAVENOUS | Status: DC

## 2024-07-27 MED ORDER — INDIGOTINDISULFONATE SODIUM 8 MG/ML IJ SOLN
INTRAMUSCULAR | Status: DC | PRN
  Administered 2024-07-27: 1 mL via INTRAVENOUS

## 2024-07-27 MED ORDER — SUGAMMADEX SODIUM 200 MG/2ML IV SOLN
INTRAVENOUS | Status: AC
  Filled 2024-07-27: qty 2

## 2024-07-27 MED ORDER — FENTANYL CITRATE (PF) 100 MCG/2ML IJ SOLN
INTRAMUSCULAR | Status: DC | PRN
  Administered 2024-07-27 (×2): 50 ug via INTRAVENOUS
  Administered 2024-07-27: 100 ug via INTRAVENOUS
  Administered 2024-07-27: 50 ug via INTRAVENOUS
  Administered 2024-07-27: 100 ug via INTRAVENOUS

## 2024-07-27 MED ORDER — BUPIVACAINE HCL (PF) 0.5 % IJ SOLN
INTRAMUSCULAR | Status: DC | PRN
  Administered 2024-07-27: 30 mL

## 2024-07-27 MED ORDER — BUPIVACAINE HCL (PF) 0.5 % IJ SOLN
INTRAMUSCULAR | Status: AC
  Filled 2024-07-27: qty 30

## 2024-07-27 SURGICAL SUPPLY — 37 items
BLADE SURG 15 STRL LF DISP TIS (BLADE) ×1 IMPLANT
CAUTERY HOOK MNPLR 1.6 DVNC XI (INSTRUMENTS) ×1 IMPLANT
CHLORAPREP W/TINT 26 (MISCELLANEOUS) ×1 IMPLANT
CLIP LIGATING HEM O LOK PURPLE (MISCELLANEOUS) ×1 IMPLANT
COVER LIGHT HANDLE (MISCELLANEOUS) IMPLANT
DEFOGGER SCOPE WARM SEASHARP (MISCELLANEOUS) ×1 IMPLANT
DERMABOND ADVANCED .7 DNX12 (GAUZE/BANDAGES/DRESSINGS) ×1 IMPLANT
DRAPE ARM DVNC X/XI (DISPOSABLE) ×4 IMPLANT
DRAPE COLUMN DVNC XI (DISPOSABLE) ×1 IMPLANT
ELECTRODE REM PT RTRN 9FT ADLT (ELECTROSURGICAL) ×1 IMPLANT
FORCEPS BPLR R/ABLATION 8 DVNC (INSTRUMENTS) ×1 IMPLANT
FORCEPS PROGRASP DVNC XI (FORCEP) ×1 IMPLANT
GLOVE BIOGEL PI IND STRL 6.5 (GLOVE) ×2 IMPLANT
GLOVE BIOGEL PI IND STRL 7.0 (GLOVE) ×3 IMPLANT
GLOVE SURG SS PI 6.5 STRL IVOR (GLOVE) ×2 IMPLANT
GOWN STRL REUS W/TWL LRG LVL3 (GOWN DISPOSABLE) ×3 IMPLANT
GRASPER SUT TROCAR 14GX15 (MISCELLANEOUS) ×1 IMPLANT
KIT TURNOVER KIT A (KITS) ×1 IMPLANT
MANIFOLD NEPTUNE II (INSTRUMENTS) ×1 IMPLANT
NDL HYPO 21X1.5 SAFETY (NEEDLE) ×1 IMPLANT
NDL INSUFFLATION 14GA 120MM (NEEDLE) ×1 IMPLANT
NEEDLE HYPO 21X1.5 SAFETY (NEEDLE) ×1 IMPLANT
NEEDLE INSUFFLATION 14GA 120MM (NEEDLE) ×1 IMPLANT
OBTURATOR OPTICALSTD 8 DVNC (TROCAR) ×1 IMPLANT
PACK LAP CHOLE LZT030E (CUSTOM PROCEDURE TRAY) ×1 IMPLANT
PAD ARMBOARD POSITIONER FOAM (MISCELLANEOUS) ×1 IMPLANT
PENCIL HANDSWITCHING (ELECTRODE) ×1 IMPLANT
POSITIONER HEAD 8X9X4 ADT (SOFTGOODS) ×1 IMPLANT
SEAL UNIV 5-12 XI (MISCELLANEOUS) ×4 IMPLANT
SET BASIN LINEN APH (SET/KITS/TRAYS/PACK) ×1 IMPLANT
SET TUBE SMOKE EVAC HIGH FLOW (TUBING) ×1 IMPLANT
SUT MNCRL AB 4-0 PS2 18 (SUTURE) ×2 IMPLANT
SUT VICRYL 0 UR6 27IN ABS (SUTURE) ×1 IMPLANT
SYR 30ML LL (SYRINGE) ×1 IMPLANT
SYSTEM RETRIEVL 5MM INZII UNIV (BASKET) ×1 IMPLANT
TUBE CONNECTING 12X1/4 (SUCTIONS) IMPLANT
WATER STERILE IRR 500ML POUR (IV SOLUTION) ×1 IMPLANT

## 2024-07-27 NOTE — Anesthesia Procedure Notes (Signed)
 Procedure Name: Intubation Date/Time: 07/27/2024 9:15 AM  Performed by: Herschell Hollering, MDPre-anesthesia Checklist: Patient identified Patient Re-evaluated:Patient Re-evaluated prior to induction Oxygen Delivery Method: Circle system utilized Preoxygenation: Pre-oxygenation with 100% oxygen Induction Type: IV induction Ventilation: Mask ventilation without difficulty Laryngoscope Size: 3 Grade View: Grade II Tube type: Oral Tube size: 7.5 mm Number of attempts: 1 Airway Equipment and Method: Patient positioned with wedge pillow Placement Confirmation: ETT inserted through vocal cords under direct vision Secured at: 21 cm Tube secured with: Tape

## 2024-07-27 NOTE — Discharge Instructions (Signed)
 Ambulatory Surgery Discharge Instructions  General Anesthesia or Sedation Do not drive or operate heavy machinery for 24 hours.  Do not consume alcohol, tranquilizers, sleeping medications, or any non-prescribed medications for 24 hours. Do not make important decisions or sign any important papers in the next 24 hours. You should have someone with you tonight at home.  Activity  You are advised to go directly home from the hospital.  Restrict your activities and rest for a day.  Resume light activity tomorrow. No heavy lifting over 10 lbs or strenuous exercise.  Fluids and Diet Begin with clear liquids, bouillon, dry toast, soda crackers.  If not nauseated, you may go to a regular diet when you desire.  Greasy and spicy foods are not advised.  Medications  If you have not had a bowel movement in 24 hours, take 2 tablespoons over the counter Milk of mag.             You May resume your blood thinners tomorrow (Aspirin, coumadin, or other).  You are being discharged with prescriptions for Opioid/Narcotic Medications: There are some specific considerations for these medications that you should know. Opioid Meds have risks & benefits. Addiction to these meds is always a concern with prolonged use Take medication only as directed Do not drive while taking narcotic pain medication Do not crush tablets or capsules Do not use a different container than medication was dispensed in Lock the container of medication in a cool, dry place out of reach of children and pets. Opioid medication can cause addiction Do not share with anyone else (this is a felony) Do not store medications for future use. Dispose of them properly.     Disposal:  Find a [employer] household drug take back site near you.  If you can't get to a drug take back site, use the recipe below as a last resort to dispose of expired, unused or unwanted drugs. Disposal  (Do not dispose chemotherapy drugs this way, talk to your  prescribing doctor instead.) Step 1: Mix drugs (do not crush) with dirt, kitty litter, or used coffee grounds and add a small amount of water to dissolve any solid medications. Step 2: Seal drugs in plastic bag. Step 3: Place plastic bag in trash. Step 4: Take prescription container and scratch out personal information, then recycle or throw away.  Operative Site  You have a liquid bandage over your incisions, this will begin to flake off in about a week. Ok to English as a second language teacher. Keep wound clean and dry. No baths or swimming. No lifting more than 10 pounds.  Contact Information: If you have questions or concerns, please call our office, [phone], Monday- Thursday 8AM-5PM and Friday 8AM-12Noon.  If it is after hours or on the weekend, please call [facility] Main Number, [phone], and ask to speak to the surgeon on call for Dr. Robyne Peers at [facility].   SPECIFIC COMPLICATIONS TO WATCH FOR: Inability to urinate Fever over 101? F by mouth Nausea and vomiting lasting longer than 24 hours. Pain not relieved by medication ordered Swelling around the operative site Increased redness, warmth, hardness, around operative area Numbness, tingling, or cold fingers or toes Blood -soaked dressing, (small amounts of oozing may be normal) Increasing and progressive drainage from surgical area or exam site

## 2024-07-27 NOTE — Plan of Care (Signed)
  Problem: Clinical Measurements: Goal: Ability to maintain clinical measurements within normal limits will improve Outcome: Progressing Goal: Cardiovascular complication will be avoided Outcome: Progressing   Problem: Activity: Goal: Risk for activity intolerance will decrease Outcome: Progressing   Problem: Nutrition: Goal: Adequate nutrition will be maintained Outcome: Progressing   Problem: Coping: Goal: Level of anxiety will decrease Outcome: Progressing

## 2024-07-27 NOTE — Progress Notes (Signed)
 [facility] Progress Note  * Day of Surgery *  Subjective: Patient seen and examined.  She is resting comfortably in bed.  She just started to develop a small amount of abdominal pain.  She otherwise has no new complaints.  Objective: Vital signs in last 24 hours: Temp:  [98.4 F (36.9 C)-98.8 F (37.1 C)] 98.7 F (37.1 C) (08/10 0518) Pulse Rate:  [68-78] 68 (08/10 0518) Resp:  [17-18] 18 (08/10 0518) BP: (99-121)/(53-66) 99/57 (08/10 0518) SpO2:  [95 %-96 %] 96 % (08/10 0518) Weight:  [60 kg] 60 kg (08/10 0518) Last BM Date : 07/22/24  Intake/Output from previous day: 08/09 0701 - 08/10 0700 In: 1482.1 [P.O.:720; I.V.:762.1] Out: -  Intake/Output this shift: No intake/output data recorded.  General appearance: alert, cooperative, and no distress GI: Abdomen soft, nondistended, no percussion tenderness; mild lower abdominal TTP; no rigidity, guarding, or rebound tenderness; negative murphy's sign  Lab Results:  Recent Labs    07/25/24 0529 07/26/24 0453  WBC 11.9* 8.6  HGB 12.6 12.3  HCT 37.7 35.6*  PLT 275 265   BMET Recent Labs    07/26/24 0453 07/27/24 0445  NA 138 136  K 3.4* 3.7  CL 105 103  CO2 27 28  GLUCOSE 123* 108*  BUN <5* <5*  CREATININE 0.68 0.70  CALCIUM 8.3* 8.2*   PT/INR No results for input(s): LABPROT, INR in the last 72 hours.  Studies/Results: US  Abdomen Limited RUQ (LIVER/GB) Result Date: 07/26/2024 CLINICAL DATA:  [id number] Right-sided abdominal pain of unknown cause [id number] EXAM: ULTRASOUND ABDOMEN LIMITED RIGHT UPPER QUADRANT COMPARISON:  July 25, 2024 FINDINGS: Gallbladder: No gallstones. Moderate gallbladder wall thickening measuring 1 cm with gallbladder wall edema. Trace pericholecystic fluid. No sonographic Murphy's sign noted by sonographer. Common bile duct: Diameter: 4 mm Liver: Normal echogenicity. No focal lesion identified. No intrahepatic biliary ductal dilation. Portal vein is patent on color Doppler  imaging with normal direction of blood flow towards the liver. Other: None. IMPRESSION: Gallbladder wall thickening with wall edema and trace pericholecystic fluid, which in the absence of stones and a sonographic Murphy's sign, may be due to acute hepatitis, upper abdominal inflammation, or volume overload possibly from CHF or renal failure. Laboratory correlation recommended. Electronically Signed   By: Rogelia Myers M.D.   On: 07/26/2024 12:20   CT Angio Abd/Pel w/ and/or w/o Result Date: 07/25/2024 CLINICAL DATA:  Postprandial lower abdominal pain. Rule out mesenteric ischemia. EXAM: CTA ABDOMEN AND PELVIS WITHOUT AND WITH CONTRAST TECHNIQUE: Multidetector CT imaging of the abdomen and pelvis was performed using the standard protocol during bolus administration of intravenous contrast. Multiplanar reconstructed images and MIPs were obtained and reviewed to evaluate the vascular anatomy. RADIATION DOSE REDUCTION: This exam was performed according to the departmental dose-optimization program which includes automated exposure control, adjustment of the mA and/or kV according to patient size and/or use of iterative reconstruction technique. CONTRAST:  80mL OMNIPAQUE  IOHEXOL  350 MG/ML SOLN COMPARISON:  CT abdomen pelvis 07/23/2024 and 07/21/2024 FINDINGS: VASCULAR Aorta: Normal caliber aorta without aneurysm, dissection, vasculitis or significant stenosis. Celiac: Patent without evidence of aneurysm, dissection, vasculitis or significant stenosis. SMA: Patent without evidence of aneurysm, dissection, vasculitis or significant stenosis. Renals: Bilateral renal arteries are widely patent without aneurysm or dissection. Single right renal artery. Two left renal arteries. IMA: Patent without evidence of aneurysm, dissection, vasculitis or significant stenosis. Inflow: Patent without evidence of aneurysm, dissection, vasculitis or significant stenosis. Proximal Outflow: Proximal femoral arteries are widely patent.  Veins:  Portal venous system is patent. No gross abnormality to the IVC or renal veins. No gross abnormality to the iliac veins. Review of the MIP images confirms the above findings. NON-VASCULAR Lower chest: Subtle pleural-based densities in posterior left lower lobe on image 16/6 are new from the recent comparison examination and suspect this is related to atelectasis. No pleural effusions. Hepatobiliary: Diffuse gallbladder wall thickening and cannot exclude pericholecystic fluid. This finding is new from the recent comparison examinations. Limited evaluation for gallstones. Findings are concerning for acute cholecystitis. Small amount of periportal edema. No significant biliary dilatation. Pancreas: Unremarkable. No pancreatic ductal dilatation or surrounding inflammatory changes. Spleen: Normal in size without focal abnormality. Adrenals/Urinary Tract: Normal appearance of the adrenal glands. Normal appearance of both kidneys without hydronephrosis. No suspicious renal lesions. Urinary bladder is decompressed. Stomach/Bowel: No bowel dilatation. No focal bowel inflammation. Normal appearance of the stomach. Normal appendix. Lymphatic: No significant lymph node enlargement in the abdomen or pelvis. Reproductive: Limited evaluation of the uterus and adnexal structures but no gross abnormality or significant change since the recent comparison examinations. Other: Small to moderate amount of free fluid in the pelvis. There is fluid the cul-de-sac and fluid anterior to the uterine fundus. Pelvic free fluid has increased from the previous examination. Musculoskeletal: No acute bone abnormality. IMPRESSION: VASCULAR 1. Arterial structures in the abdomen and pelvis are widely patent. No evidence for mesenteric ischemia. NON-VASCULAR 1. New fluid around the gallbladder is suggestive for wall thickening and pericholecystic fluid. There is also increased free fluid in the pelvis. Findings are concerning for acute  cholecystitis. Consider further characterization with right upper quadrant ultrasound for better characterization. Electronically Signed   By: Juliene Balder M.D.   On: 07/25/2024 16:12    Anti-infectives: Anti-infectives (From admission, onward)    Start     Dose/Rate Route Frequency Ordered Stop   07/27/24 0900  ceFAZolin  (ANCEF ) IVPB 2g/100 mL premix        2 g 200 mL/hr over 30 Minutes Intravenous 30 min pre-op 07/26/24 1439         Assessment/Plan:  Patient is a 43 year old female who was admitted with intractable abdominal pain, nausea, and vomiting.  Imaging is concerning for acute cholecystitis.  -Plan for surgery this morning.  All questions answered -NPO -IVF -Ancef  -ICG ordered -Discussed that if her pain is not from her gallbladder, this surgery may not resolve all of her pain -Plan for diet after surgery.  If patient tolerates diet postop without N/V and pain is controlled with oral medications, she will be stable for discharge home from a general surgery standpoint -Further recommendations to follow surgery   LOS: 3 days    Jahleel Stroschein A Britteny Fiebelkorn 07/27/2024  Note: Portions of this report may have been transcribed using voice recognition software. Every effort has been made to ensure accuracy; however, inadvertent computerized transcription errors may still be present.

## 2024-07-27 NOTE — Progress Notes (Addendum)
 PROGRESS NOTE    Patient: Ashley Soto                            PCP: Beverli Casa                    DOB: June 19, 1981            DOA: 07/23/2024 FMW:[id number]             DOS: 07/27/2024, 10:22 AM   LOS: 3 days   Date of Service: The patient was seen and examined on 07/27/2024  Subjective:   The patient was seen and examined this morning, stable, Has been n.p.o. overnight, denies any nausea vomiting Abdominal pain with palpation N.p.o. in anticipation of cholecystectomy this morning  CT angiogram discussed with patient for possible cholecystitis Ultrasound of the abdomen completed pending results    Brief Narrative:   Ashley Soto is a 43 y.o. female with PMH anxiety, depression, PCOS, benzodiazepine withdrawal seizures who presents emerged part for evaluation of abdominal pain.  Patient was seen yesterday morning for similar complaints and diagnosed with enteritis with a negative CT and pelvic ultrasound.  States that symptoms worsened tonight and return to the emergency department for further evaluation.  Denies chest pain, shortness of breath, headache, fever or other systemic symptoms     Assessment & Plan:   Principal Problem:   Intractable abdominal pain Active Problems:   Anxiety state   History of substance abuse (HCC)   Primary insomnia   History of seizure   Generalized abdominal pain   Nausea and vomiting   Cholecystitis, acute     Assessment and Plan: * Intractable abdominal pain with nausea vomiting-with acute cholecystitis  Addendum 07/27/2024 status post robotic assisted laparoscopic cholecystectomy -Anticipating robotic assisted lap cholecystectomy this morning -N.p.o. --- per surgery advancing diet - As needed analgesics   -CT angio.  Reviewed Findings are concerning for acute Cholecystitis - Right upper quadrant ultrasound: Gallbladder wall thickening with wall edema and trace pericholecystic fluid,  -Mildly elevated LFTs although T. bili at  0.2    - Initial CT abdomen/pelvis acute, nonspecific findings possible enteritis ureteral thickening -pelvic ultrasound-within normal limits -Consulted and discussed the above findings with gastroenterology/and OB/GYN no acute findings or intervention were recommended at this point   -Continue IV scheduled Reglan  -As needed IV analgesics    Anxiety state - Home medication of Celexa -As needed Xanax   History of substance abuse (HCC) -No signs of withdrawal h/o status post detoxification for Xanax  abuse, May 2008,  - Denies any recent use or abuse  - Urine drug screen positive for amphetamine, opioids (patient is on Adderall, and has been receiving Norco for abdominal pain)  Primary insomnia Continue as needed trazodone   History of seizure Not on any medications History of seizures in the context of withdrawal of benzodiazepine -Monitor closely -Continue recent use of benzodiazepines    Nutritional status:  The patient's BMI is: Body mass index is 22.01 kg/m. I agree with the assessment and plan as outlined  ------------------------------------------------------------------------------------------------------------------------------------------------  DVT prophylaxis:  SCD's Start: 07/26/24 1610 heparin  injection 5,000 Units Start: 07/23/24 2200 SCDs Start: 07/23/24 0715   Code Status:   Code Status: Full Code  Family Communication: Husband present at bedside, updated, not satisfied with the results very upset!   Admission status:   Status is: Observation The patient remains OBS appropriate and will d/c before 2 midnights.   Disposition: From  -  home             Planning for discharge in 1-2 days   Procedures:   No admission procedures for hospital encounter.   Antimicrobials:  Anti-infectives (From admission, onward)    Start     Dose/Rate Route Frequency Ordered Stop   07/27/24 0900  [MAR Hold]  ceFAZolin  (ANCEF ) IVPB 2g/100 mL premix        (MAR  Hold since Sun 07/27/2024 at 0905.Hold Reason: Transfer to a Procedural area)   2 g 200 mL/hr over 30 Minutes Intravenous 30 min pre-op 07/26/24 1439 07/27/24 0902        Medication:   [MAR Hold] busPIRone   15 mg Oral BID   chlorhexidine   15 mL Mouth/Throat Once   Or   mouth rinse  15 mL Mouth Rinse Once   Chlorhexidine  Gluconate Cloth  6 each Topical Once   [MAR Hold] dicyclomine   20 mg Oral BID   [MAR Hold] ferrous sulfate   325 mg Oral Q breakfast   [MAR Hold] FLUoxetine   40 mg Oral q morning   [MAR Hold] heparin   5,000 Units Subcutaneous Q8H   indocyanine green        indocyanine green   2.5 mg Intravenous Once   [MAR Hold] metoCLOPramide  (REGLAN ) injection  10 mg Intravenous Q8H   [MAR Hold] pantoprazole   40 mg Oral BID   [MAR Hold] polyethylene glycol  17 g Oral Daily   scopolamine   1 patch Transdermal Once   [MAR Hold] sennosides  10 mL Oral Daily   [MAR Hold] sodium chloride  flush  3 mL Intravenous Q12H    [MAR Hold] acetaminophen  **OR** [MAR Hold] acetaminophen , [MAR Hold] ALPRAZolam , [MAR Hold] alum & mag hydroxide-simeth, dexamethasone  (DECADRON ) injection, fentaNYL  (SUBLIMAZE ) injection, [MAR Hold]  HYDROmorphone  (DILAUDID ) injection, indocyanine green , [MAR Hold] ipratropium, [MAR Hold] levalbuterol , [MAR Hold] ondansetron  **OR** [MAR Hold] ondansetron  (ZOFRAN ) IV, [MAR Hold] mouth rinse, [MAR Hold] oxyCODONE , oxyCODONE  **OR** oxyCODONE , sterile water , [MAR Hold] traZODone    Objective:   Vitals:   07/26/24 0443 07/26/24 1510 07/26/24 2122 07/27/24 0518  BP: 111/66 121/66 (!) 107/53 (!) 99/57  Pulse: 71 76 78 68  Resp: 20 17 18 18   Temp: 98.2 F (36.8 C) 98.4 F (36.9 C) 98.8 F (37.1 C) 98.7 F (37.1 C)  TempSrc: Oral Oral Oral Oral  SpO2: 96% 95% 96% 96%  Weight: 60.3 kg   60 kg  Height:        Intake/Output Summary (Last 24 hours) at 07/27/2024 1022 Last data filed at 07/26/2024 1855 Gross per 24 hour  Intake 1283.03 ml  Output --  Net 1283.03 ml    Filed Weights   07/25/24 0500 07/26/24 0443 07/27/24 0518  Weight: 60.1 kg 60.3 kg 60 kg     Physical examination:          General:  AAO x 3,  cooperative, no distress;   HEENT:  Normocephalic, PERRL, otherwise with in Normal limits   Neuro:  CNII-XII intact. , normal motor and sensation, reflexes intact   Lungs:   Clear to auscultation BL, Respirations unlabored,  No wheezes / crackles  Cardio:    S1/S2, RRR, No murmure, No Rubs or Gallops   Abdomen:  Soft, diffuse tenderness with deep palpation, bowel sounds active all four quadrants, no guarding or peritoneal signs.  Muscular  skeletal:  Limited exam -global generalized weaknesses - in bed, able to move all 4 extremities,   2+ pulses,  symmetric, No pitting edema  Skin:  Dry, warm to touch, negative for any Rashes,  Wounds: Please see nursing documentation          -------------------------------------------------------------------------------------------------------------    LABs:     Latest Ref Rng & Units 07/26/2024    4:53 AM 07/25/2024    5:29 AM 07/24/2024    4:30 AM  CBC  WBC 4.0 - 10.5 K/uL 8.6  11.9  9.6   Hemoglobin 12.0 - 15.0 g/dL 87.6  87.3  88.5   Hematocrit 36.0 - 46.0 % 35.6  37.7  34.1   Platelets 150 - 400 K/uL 265  275  247       Latest Ref Rng & Units 07/27/2024    4:45 AM 07/26/2024    5:41 AM 07/26/2024    4:53 AM  CMP  Glucose 70 - 99 mg/dL 891   876   BUN 6 - 20 mg/dL <5   <5   Creatinine 9.55 - 1.00 mg/dL 9.29   9.31   Sodium 864 - 145 mmol/L 136   138   Potassium 3.5 - 5.1 mmol/L 3.7   3.4   Chloride 98 - 111 mmol/L 103   105   CO2 22 - 32 mmol/L 28   27   Calcium 8.9 - 10.3 mg/dL 8.2   8.3   Total Protein 6.5 - 8.1 g/dL 5.3  5.6    Total Bilirubin 0.0 - 1.2 mg/dL 0.6  0.2    Alkaline Phos 38 - 126 U/L 36  40    AST 15 - 41 U/L 24  46    ALT 0 - 44 U/L 69  88     Micro Results No results found for this or any previous visit (from the past 240 hours).  Radiology Reports No  results found.   SIGNED: Adriana DELENA Grams, MD, FHM. FAAFP. Jolynn [facility] - [facility] Time spent - 55 min.  In seeing, evaluating and examining the patient. Reviewing medical records, labs, drawn plan of care. [facility],  Pager (please use [email or web address] to page/ text) Please use Epic Secure Chat for non-urgent communication (7AM-7PM)  If 7PM-7AM, please contact night-coverage [email or web address], 07/27/2024, 10:22 AM

## 2024-07-27 NOTE — Progress Notes (Signed)
 All oral medications held r/t pt vomiting and as advised by [facility] MD. Pt given medication to treat NV as well.

## 2024-07-27 NOTE — Anesthesia Preprocedure Evaluation (Addendum)
 Anesthesia Evaluation  Patient identified by MRN, date of birth, ID band Patient awake    Reviewed: Allergy & Precautions, H&P , NPO status , Patient's Chart, lab work & pertinent test results  Airway Mallampati: II  TM Distance: >3 FB Neck ROM: Full    Dental no notable dental hx.    Pulmonary Current Smoker and Patient abstained from smoking.   Pulmonary exam normal breath sounds clear to auscultation       Cardiovascular negative cardio ROS Normal cardiovascular exam Rhythm:Regular Rate:Normal     Neuro/Psych  PSYCHIATRIC DISORDERS Anxiety Depression    negative neurological ROS     GI/Hepatic negative GI ROS, Neg liver ROS,,,  Endo/Other  negative endocrine ROS    Renal/GU negative Renal ROS  negative genitourinary   Musculoskeletal negative musculoskeletal ROS (+)    Abdominal   Peds negative pediatric ROS (+)  Hematology negative hematology ROS (+)   Anesthesia Other Findings   Reproductive/Obstetrics negative OB ROS                              Anesthesia Physical Anesthesia Plan  ASA: 2 and emergent  Anesthesia Plan: General   Post-op Pain Management:    Induction: Intravenous, Rapid sequence and Cricoid pressure planned  PONV Risk Score and Plan: Ondansetron   Airway Management Planned: Oral ETT  Additional Equipment:   Intra-op Plan:   Post-operative Plan: Extubation in OR  Informed Consent: I have reviewed the patients History and Physical, chart, labs and discussed the procedure including the risks, benefits and alternatives for the proposed anesthesia with the patient or authorized representative who has indicated his/her understanding and acceptance.     Dental advisory given  Plan Discussed with: Surgeon  Anesthesia Plan Comments:         Anesthesia Quick Evaluation

## 2024-07-27 NOTE — Op Note (Signed)
 [facility] Operative Note  07/27/24  Preoperative Diagnosis: Acute cholecystitis   Postoperative Diagnosis: Same   Procedure(s) Performed: Robotic Assisted Laparoscopic Cholecystectomy   Surgeon: Dorothyann Brittle, DO   Assistants: No qualified resident was available    Anesthesia: General endotracheal   Anesthesiologist: Herschell Hollering, MD    Specimens: Gallbladder   Estimated Blood Loss: Minimal   Blood Replacement: None    Complications: None   Wound Class: Contaminated   Operative Indications: The patient was admitted with intractable abdominal pain, nausea, and vomiting.  CTA demonstrated concern for acute cholecystitis, and US  demonstrated wall thickening and pericholecystic fluid.  Given the imaging findings with her pain, decision was made to take the patient to the operating room for cholecystectomy.  We discussed the risk of the procedure including but not limited to bleeding, infection, injury to the common bile duct, bile leak, need for further procedures, chance of subtotal cholecystectomy.   Findings:  Edematous gallbladder, moderate distention Critical view of safety noted All clips intact at the end of the case Adequate hemostasis   Procedure: Firefly was given in the preoperative area.  The patient was taken to the operating room and placed supine. General endotracheal anesthesia was induced. Intravenous antibiotics were administered per protocol.  An orogastric tube positioned to decompress the stomach. The abdomen was prepared and draped in the usual sterile fashion. A time-out was completed verifying correct patient, procedure, site, positioning, and implant(s) and/or special equipment prior to beginning this procedure.  Veress needle was placed at the infraumbilical area and insufflation was started after confirming a positive saline drop test and no immediate increase in abdominal pressure.  After reaching 15 mm, the Veress needle was  removed and a 8 mm port was placed via optiview technique infraumbilical, measuring 20 mm away from the suspected position of the gallbladder.  The abdomen was inspected and no abnormalities or injuries were found.  Under direct vision, ports were placed in the following locations in a semi curvilinear position around the target of the gallbladder: Two 8 mm ports on the patient's right each having 8cm clearance to the adjacent ports and one 8 mm port placed on the patient's left 8 cm from the umbilical port. Once ports were placed, the table was placed in the reverse Trendelenburg position with the right side up. The Xi platform was brought into the operative field and docked to the ports successfully.  An endoscope was placed through the umbilical port, prograsp through the most lateral right port, fenestrated bipolar to the port just right of the umbilicus, and then a hook cautery in the left port.  The dome of the gallbladder was grasped with prograsp and retracted over the dome of the liver. Adhesions between the gallbladder and omentum, duodenum and transverse colon were lysed via hook cautery. The infundibulum was grasped with the fenestrated grasper and retracted toward the right lower quadrant. This maneuver exposed Calot's triangle. Firefly was used throughout the dissection to ensure safe visualization of the cystic duct.  The peritoneum overlying the gallbladder infundibulum was then dissected and the cystic duct and cystic artery identified.  Critical view of safety with the liver bed clearly visible behind the duct and artery with no additional structures noted.  The cystic duct and cystic artery were doubly clipped and divided close to the gallbladder.    The gallbladder was then dissected from its peritoneal and liver bed attachments by electrocautery. Hemostasis was checked prior to removing the hook cautery.  The  Anton was undocked and moved out of the field.  A 5mm Endo Catch bag was then placed  through the umbilical port and the gallbladder was removed.  The gallbladder was passed off the table as a specimen. There was no evidence of bleeding from the gallbladder fossa or cystic artery or leakage of the bile from the cystic duct stump. The umbilical port site closed with a 0 vicryl with a PMI needle.  The abdomen was desufflated and secondary trocars were removed under direct vision. No bleeding was noted. Incisions were localized with marcaine .  All skin incisions were closed with subcuticular sutures of 4-0 monocryl and dermabond.   Final inspection revealed acceptable hemostasis. All counts were correct at the end of the case. The patient was awakened from anesthesia and extubated without complication. The OG tube was removed.  The patient went to the PACU in stable condition.   Dorothyann Brittle, DO [facility] [address] [place], [place] [address] [phone] (office)

## 2024-07-27 NOTE — Plan of Care (Signed)

## 2024-07-27 NOTE — Transfer of Care (Signed)
 Immediate Anesthesia Transfer of Care Note  Patient: Ashley Soto  Procedure(s) Performed: CHOLECYSTECTOMY, ROBOT-ASSISTED, LAPAROSCOPIC (Abdomen)  Patient Location: PACU  Anesthesia Type:General  Level of Consciousness: awake, drowsy, and patient cooperative  Airway & Oxygen Therapy: Patient Spontanous Breathing  Post-op Assessment: Report given to RN and Post -op Vital signs reviewed and stable  Post vital signs: Reviewed and stable  Last Vitals:  Vitals Value Taken Time  BP 126/72 07/27/24 11:00  Temp 36.5 C 07/27/24 10:58  Pulse 51 07/27/24 11:07  Resp 19 07/27/24 11:07  SpO2 100 % 07/27/24 11:07  Vitals shown include unfiled device data.  Last Pain:  Vitals:   07/27/24 1058  TempSrc:   PainSc: Asleep      Patients Stated Pain Goal: 2 (07/26/24 2010)  Complications: No notable events documented.

## 2024-07-27 NOTE — Progress Notes (Signed)
 [facility]  Spoke with the patient's husband in her room on the floor.  I explained that she tolerated the procedure without difficulty.  She has dissolvable stitches under the skin with overlying skin glue.  This will flake off in 10 to 14 days.  We will see how she does postoperatively.  If she tolerates a diet without nausea and vomiting and her pain is controlled with oral medications, then she will be stable for discharge from a surgical standpoint The patient will follow-up with me in 2 weeks.  All questions were answered to his expressed satisfaction.  Plan: -Return to room on the floor -Regular diet -PRN pain control and antiemetics -No need for antibiotics from surgical standpoint -Patient stable for discharge from surgical standpoint if she tolerates her diet without nausea and vomiting and pain is controlled with oral medicines  Dorothyann Brittle, DO [facility] [address] [place], [place] [address] [phone] (office)

## 2024-07-28 ENCOUNTER — Telehealth: Payer: Self-pay | Admitting: Gastroenterology

## 2024-07-28 DIAGNOSIS — R109 Unspecified abdominal pain: Secondary | ICD-10-CM | POA: Diagnosis not present

## 2024-07-28 LAB — COMPREHENSIVE METABOLIC PANEL WITH GFR
ALT: 60 U/L — ABNORMAL HIGH (ref 0–44)
AST: 22 U/L (ref 15–41)
Albumin: 3 g/dL — ABNORMAL LOW (ref 3.5–5.0)
Alkaline Phosphatase: 39 U/L (ref 38–126)
Anion gap: 7 (ref 5–15)
BUN: <5 mg/dL — ABNORMAL LOW (ref 6–20)
CO2: 25 mmol/L (ref 22–32)
Calcium: 8.3 mg/dL — ABNORMAL LOW (ref 8.9–10.3)
Chloride: 104 mmol/L (ref 98–111)
Creatinine, Ser: 0.6 mg/dL (ref 0.44–1.00)
GFR, Estimated: >60 mL/min (ref >60)
Glucose, Bld: 128 mg/dL — ABNORMAL HIGH (ref 70–99)
Potassium: 3.6 mmol/L (ref 3.5–5.1)
Sodium: 136 mmol/L (ref 135–145)
Total Bilirubin: 0.6 mg/dL (ref 0.0–1.2)
Total Protein: 5.6 g/dL — ABNORMAL LOW (ref 6.5–8.1)

## 2024-07-28 LAB — GLUCOSE, CAPILLARY: Glucose-Capillary: 118 mg/dL — ABNORMAL HIGH (ref 70–99)

## 2024-07-28 MED ORDER — FERROUS SULFATE 325 (65 FE) MG PO TABS: 325.0000 mg | ORAL_TABLET | Freq: Every day | ORAL | 0 refills | Status: AC

## 2024-07-28 NOTE — Telephone Encounter (Signed)
 Devere: please arrange hospital follow-up with [facility] or [facility] in about 4-6 weeks. Thanks!

## 2024-07-28 NOTE — Progress Notes (Signed)
 Chart reviewed. Will arrange outpatient GI follow-up with [facility]. GI signing off.

## 2024-07-28 NOTE — Discharge Summary (Signed)
 Physician Discharge Summary   Patient: Ashley Soto MRN: [id number] DOB: 1981-03-13  Admit date:     07/23/2024  Discharge date: 07/28/24  Discharge Physician: Adriana DELENA Grams   PCP: Beverli Casa   Recommendations at discharge:  Follow-up with PCP in 1 week Follow-up with general surgery 1 week Continue to advance diet slowly,  low-fat diet   Discharge Diagnoses: Principal Problem:   Intractable abdominal pain Active Problems:   Anxiety state   History of substance abuse (HCC)   Primary insomnia   History of seizure   Generalized abdominal pain   Nausea and vomiting   Cholecystitis, acute  Resolved Problems:   * No resolved hospital problems. *  Hospital Course: Ashley Soto is a 43 y.o. female with PMH anxiety, depression, PCOS, benzodiazepine withdrawal seizures who presents emerged part for evaluation of abdominal pain.  Patient was seen yesterday morning for similar complaints and diagnosed with enteritis with a negative CT and pelvic ultrasound.  States that symptoms worsened tonight and return to the emergency department for further evaluation.  Denies chest pain, shortness of breath, headache, fever or other systemic symptoms    Cholecystitis Intractable abdominal pain with nausea vomiting-with acute cholecystitis    status post robotic assisted laparoscopic cholecystectomy - Tolerated procedure well - Advancing diet slowly,     -CT angio.  Reviewed Findings are concerning for acute Cholecystitis - Right upper quadrant ultrasound: Gallbladder wall thickening with wall edema and trace pericholecystic fluid,  -Mildly elevated LFTs although T. bili at 0.2       - Initial CT abdomen/pelvis acute, nonspecific findings possible enteritis ureteral thickening -pelvic ultrasound-within normal limits -Consulted and discussed the above findings with gastroenterology/and OB/GYN no acute findings or intervention were recommended at this point   -Was treated with a as  needed IV Reglan , and analgesics  Tolerating p.o. now switching to p.o. as needed Zofran , analgesics       Anxiety state - Home medication of Celexa    History of substance abuse (HCC) -No signs of withdrawal h/o status post detoxification for Xanax  abuse, May 2008,  - Denies any recent use or abuse  - Urine drug screen positive for amphetamine, opioids (patient is on Adderall, and has been receiving Norco for abdominal pain)   Primary insomnia Continue as needed trazodone    History of seizure Not on any medications History of seizures in the context of withdrawal of benzodiazepine -Monitor closely -Continue recent use of benzodiazepines       Nutritional status:  The patient's BMI is: Body mass index is 22.01 kg/m. I agree with the assessment and plan as outlined        Pain control - [place]  Controlled Substance Reporting System database was reviewed. and patient was instructed, not to drive, operate heavy machinery, perform activities at heights, swimming or participation in water  activities or provide baby-sitting services while on Pain, Sleep and Anxiety Medications; until their outpatient Physician has advised to do so again. Also recommended to not to take more than prescribed Pain, Sleep and Anxiety Medications.  Consultants: General Surgery Procedures performed: Robot-assisted laparoscopic cholecystectomy Disposition: Home Diet recommendation:  Discharge Diet Orders (From admission, onward)     Start     Ordered   07/28/24 0000  Diet full liquid        07/28/24 0739   07/27/24 0000  Diet - low sodium heart healthy        07/27/24 1048  Full liquid diet DISCHARGE MEDICATION: Allergies as of 07/28/2024       Reactions   Paroxetine Hcl    Suicidal thoughts    Sulfa Antibiotics Hives        Medication List     STOP taking these medications    amoxicillin -clavulanate 875-125 MG tablet Commonly known as: AUGMENTIN     diclofenac 75 MG EC tablet Commonly known as: VOLTAREN   spironolactone 50 MG tablet Commonly known as: ALDACTONE       TAKE these medications    acetaminophen  500 MG tablet Commonly known as: TYLENOL  Take 2 tablets (1,000 mg total) by mouth every 6 (six) hours for 7 days.   amphetamine-dextroamphetamine 20 MG tablet Commonly known as: ADDERALL Take 20 mg by mouth 2 (two) times daily.   Azelaic Acid 15 % gel Apply 1 Application topically daily.   busPIRone  15 MG tablet Commonly known as: BUSPAR  Take 15 mg by mouth 2 (two) times daily.   clindamycin 1 % lotion Commonly known as: CLEOCIN T Apply 1 Application topically daily.   dicyclomine  20 MG tablet Commonly known as: BENTYL  Take 1 tablet (20 mg total) by mouth 2 (two) times daily.   docusate sodium  100 MG capsule Commonly known as: Colace Take 1 capsule (100 mg total) by mouth 2 (two) times daily.   etonogestrel  68 MG Impl implant Commonly known as: NEXPLANON  68 mg by Subdermal route once.   ferrous sulfate  325 (65 FE) MG tablet Take 1 tablet (325 mg total) by mouth daily with breakfast.   FLUoxetine  40 MG capsule Commonly known as: PROZAC  Take 40 mg by mouth every morning.   ondansetron  4 MG disintegrating tablet Commonly known as: ZOFRAN -ODT Take 1 tablet (4 mg total) by mouth every 8 (eight) hours as needed for nausea or vomiting.   oxyCODONE  5 MG immediate release tablet Commonly known as: Roxicodone  Take 1 tablet (5 mg total) by mouth every 6 (six) hours as needed.   traZODone  50 MG tablet Commonly known as: DESYREL  Take 50-100 mg by mouth at bedtime.   tretinoin 0.025 % cream Commonly known as: RETIN-A Apply topically.               Discharge Care Instructions  (From admission, onward)           Start     Ordered   07/28/24 0000  Discharge wound care:       Comments: Per surgery wound instructions   07/28/24 0739            Follow-up Information     Pappayliou,  Dorothyann LABOR, DO. Call.   Specialty: General Surgery Why: Call to schedule follow up in 2 weeks Contact information: [id number] [place] [facility] [address] [phone]                Discharge Exam: Filed Weights   07/26/24 0443 07/27/24 0518 07/28/24 0450  Weight: 60.3 kg 60 kg 60.2 kg        General:  AAO x 3,  cooperative, no distress;   HEENT:  Normocephalic, PERRL, otherwise with in Normal limits   Neuro:  CNII-XII intact. , normal motor and sensation, reflexes intact   Lungs:   Clear to auscultation BL, Respirations unlabored,  No wheezes / crackles  Cardio:    S1/S2, RRR, No murmure, No Rubs or Gallops   Abdomen:  Small surgical abdominal wounds-clean if the erythema edema or drainage Soft, non-tender, bowel sounds active all four quadrants, no guarding or  peritoneal signs.  Muscular  skeletal:  Limited exam -global generalized weaknesses - in bed, able to move all 4 extremities,   2+ pulses,  symmetric, No pitting edema  Skin:  Dry, warm to touch, small surgical abdominal wound, clean  Wounds: Please see nursing documentation          Condition at discharge: fair  The results of significant diagnostics from this hospitalization (including imaging, microbiology, ancillary and laboratory) are listed below for reference.   Imaging Studies: US  Abdomen Limited RUQ (LIVER/GB) Result Date: 07/26/2024 CLINICAL DATA:  [id number] Right-sided abdominal pain of unknown cause [id number] EXAM: ULTRASOUND ABDOMEN LIMITED RIGHT UPPER QUADRANT COMPARISON:  July 25, 2024 FINDINGS: Gallbladder: No gallstones. Moderate gallbladder wall thickening measuring 1 cm with gallbladder wall edema. Trace pericholecystic fluid. No sonographic Murphy's sign noted by sonographer. Common bile duct: Diameter: 4 mm Liver: Normal echogenicity. No focal lesion identified. No intrahepatic biliary ductal dilation. Portal vein is patent on color Doppler imaging with normal direction of blood flow  towards the liver. Other: None. IMPRESSION: Gallbladder wall thickening with wall edema and trace pericholecystic fluid, which in the absence of stones and a sonographic Murphy's sign, may be due to acute hepatitis, upper abdominal inflammation, or volume overload possibly from CHF or renal failure. Laboratory correlation recommended. Electronically Signed   By: Rogelia Myers M.D.   On: 07/26/2024 12:20   CT Angio Abd/Pel w/ and/or w/o Result Date: 07/25/2024 CLINICAL DATA:  Postprandial lower abdominal pain. Rule out mesenteric ischemia. EXAM: CTA ABDOMEN AND PELVIS WITHOUT AND WITH CONTRAST TECHNIQUE: Multidetector CT imaging of the abdomen and pelvis was performed using the standard protocol during bolus administration of intravenous contrast. Multiplanar reconstructed images and MIPs were obtained and reviewed to evaluate the vascular anatomy. RADIATION DOSE REDUCTION: This exam was performed according to the departmental dose-optimization program which includes automated exposure control, adjustment of the mA and/or kV according to patient size and/or use of iterative reconstruction technique. CONTRAST:  80mL OMNIPAQUE  IOHEXOL  350 MG/ML SOLN COMPARISON:  CT abdomen pelvis 07/23/2024 and 07/21/2024 FINDINGS: VASCULAR Aorta: Normal caliber aorta without aneurysm, dissection, vasculitis or significant stenosis. Celiac: Patent without evidence of aneurysm, dissection, vasculitis or significant stenosis. SMA: Patent without evidence of aneurysm, dissection, vasculitis or significant stenosis. Renals: Bilateral renal arteries are widely patent without aneurysm or dissection. Single right renal artery. Two left renal arteries. IMA: Patent without evidence of aneurysm, dissection, vasculitis or significant stenosis. Inflow: Patent without evidence of aneurysm, dissection, vasculitis or significant stenosis. Proximal Outflow: Proximal femoral arteries are widely patent. Veins: Portal venous system is patent. No  gross abnormality to the IVC or renal veins. No gross abnormality to the iliac veins. Review of the MIP images confirms the above findings. NON-VASCULAR Lower chest: Subtle pleural-based densities in posterior left lower lobe on image 16/6 are new from the recent comparison examination and suspect this is related to atelectasis. No pleural effusions. Hepatobiliary: Diffuse gallbladder wall thickening and cannot exclude pericholecystic fluid. This finding is new from the recent comparison examinations. Limited evaluation for gallstones. Findings are concerning for acute cholecystitis. Small amount of periportal edema. No significant biliary dilatation. Pancreas: Unremarkable. No pancreatic ductal dilatation or surrounding inflammatory changes. Spleen: Normal in size without focal abnormality. Adrenals/Urinary Tract: Normal appearance of the adrenal glands. Normal appearance of both kidneys without hydronephrosis. No suspicious renal lesions. Urinary bladder is decompressed. Stomach/Bowel: No bowel dilatation. No focal bowel inflammation. Normal appearance of the stomach. Normal appendix. Lymphatic: No significant lymph node enlargement in the  abdomen or pelvis. Reproductive: Limited evaluation of the uterus and adnexal structures but no gross abnormality or significant change since the recent comparison examinations. Other: Small to moderate amount of free fluid in the pelvis. There is fluid the cul-de-sac and fluid anterior to the uterine fundus. Pelvic free fluid has increased from the previous examination. Musculoskeletal: No acute bone abnormality. IMPRESSION: VASCULAR 1. Arterial structures in the abdomen and pelvis are widely patent. No evidence for mesenteric ischemia. NON-VASCULAR 1. New fluid around the gallbladder is suggestive for wall thickening and pericholecystic fluid. There is also increased free fluid in the pelvis. Findings are concerning for acute cholecystitis. Consider further characterization  with right upper quadrant ultrasound for better characterization. Electronically Signed   By: Juliene Balder M.D.   On: 07/25/2024 16:12   CT ABDOMEN PELVIS W CONTRAST Result Date: 07/23/2024 EXAM: CT ABDOMEN AND PELVIS WITH CONTRAST 07/23/2024 05:32:12 AM TECHNIQUE: CT of the abdomen and pelvis was performed with the administration of intravenous contrast. Multiplanar reformatted images are provided for review. Automated exposure control, iterative reconstruction, and/or weight based adjustment of the mA/kV was utilized to reduce the radiation dose to as low as reasonably achievable. COMPARISON: CT abdomen and pelvis with contrast 07/21/2024. Pelvic ultrasound 07/21/2024. CLINICAL HISTORY: Abdominal pain, acute, nonlocalized. Pt c/o abdominal pain and N/V. Was seen here on 8/4 and 8/5 for same and dx with enteritis. Pt reports symptoms returned last night after eating dinner. FINDINGS: LOWER CHEST: No acute abnormality. LIVER: The liver is unremarkable. GALLBLADDER AND BILE DUCTS: Gallbladder is unremarkable. No biliary ductal dilatation. SPLEEN: No acute abnormality. PANCREAS: No acute abnormality. ADRENAL GLANDS: No acute abnormality. KIDNEYS, URETERS AND BLADDER: No stones in the kidneys or ureters. No hydronephrosis. No perinephric or periureteral stranding. Urinary bladder is unremarkable. GI AND BOWEL: Previously noted areas of small bowel wall thickening are no longer present. No significant inflammatory changes of the bowel are present. PERITONEUM AND RETROPERITONEUM: Minimal free fluid in the anatomic pelvis is likely physiologic. No free air. VASCULATURE: Aorta is normal in caliber. LYMPH NODES: No lymphadenopathy. REPRODUCTIVE ORGANS: Edematous changes of the uterus are again noted. Prominent follicles in both ovaries are noted. BONES AND SOFT TISSUES: No acute osseous abnormality. No focal soft tissue abnormality. IMPRESSION: 1. No acute findings in the abdomen or pelvis. 2. Edematous changes of the  uterus and prominent follicles in both ovaries. Minimal free fluid in the anatomic pelvis, likely physiologic. Electronically signed by: Lonni Necessary MD 07/23/2024 05:57 AM EDT RP Workstation: [id number]   US  Pelvis Complete Result Date: 07/21/2024 CLINICAL DATA:  eval endometrial thickening on CT. EXAM: TRANSABDOMINAL AND TRANSVAGINAL ULTRASOUND OF PELVIS TECHNIQUE: Both transabdominal and transvaginal ultrasound examinations of the pelvis were performed. Transabdominal technique was performed for global imaging of the pelvis including uterus, ovaries, adnexal regions, and pelvic cul-de-sac. It was necessary to proceed with endovaginal exam following the transabdominal exam to visualize the uterus and endometrium. COMPARISON:  CT scan abdomen and pelvis from earlier the same day. FINDINGS: Uterus Measurements: 4.3 x 4.3 x 7.8 cm = volume: 74.3 mL. No fibroids or other mass visualized. Endometrium Thickness: 6.6 mm.  No focal abnormality visualized. Right ovary Measurements: 2.3 x 2.8 x 3.3 cm = volume: 11.1 mL. Normal appearance/no adnexal mass. Left ovary Measurements: 1.9 x 2.2 x 3.6 cm = volume: 8.1 mL. Normal appearance/no adnexal mass. Other findings No abnormal free fluid. IMPRESSION: *Unremarkable pelvic ultrasound exam. Electronically Signed   By: Ree Molt M.D.   On: 07/21/2024 12:36  US  Transvaginal Non-OB Result Date: 07/21/2024 CLINICAL DATA:  eval endometrial thickening on CT. EXAM: TRANSABDOMINAL AND TRANSVAGINAL ULTRASOUND OF PELVIS TECHNIQUE: Both transabdominal and transvaginal ultrasound examinations of the pelvis were performed. Transabdominal technique was performed for global imaging of the pelvis including uterus, ovaries, adnexal regions, and pelvic cul-de-sac. It was necessary to proceed with endovaginal exam following the transabdominal exam to visualize the uterus and endometrium. COMPARISON:  CT scan abdomen and pelvis from earlier the same day. FINDINGS: Uterus  Measurements: 4.3 x 4.3 x 7.8 cm = volume: 74.3 mL. No fibroids or other mass visualized. Endometrium Thickness: 6.6 mm.  No focal abnormality visualized. Right ovary Measurements: 2.3 x 2.8 x 3.3 cm = volume: 11.1 mL. Normal appearance/no adnexal mass. Left ovary Measurements: 1.9 x 2.2 x 3.6 cm = volume: 8.1 mL. Normal appearance/no adnexal mass. Other findings No abnormal free fluid. IMPRESSION: *Unremarkable pelvic ultrasound exam. Electronically Signed   By: Ree Molt M.D.   On: 07/21/2024 12:36   CT ABDOMEN PELVIS W CONTRAST Result Date: 07/21/2024 CLINICAL DATA:  Abdominal pain. EXAM: CT ABDOMEN AND PELVIS WITH CONTRAST TECHNIQUE: Multidetector CT imaging of the abdomen and pelvis was performed using the standard protocol following bolus administration of intravenous contrast. RADIATION DOSE REDUCTION: This exam was performed according to the departmental dose-optimization program which includes automated exposure control, adjustment of the mA and/or kV according to patient size and/or use of iterative reconstruction technique. CONTRAST:  100mL OMNIPAQUE  IOHEXOL  300 MG/ML  SOLN COMPARISON:  None Available. FINDINGS: Lower chest: Heart is normal size.  Lung bases are clear. Hepatobiliary: Liver, gallbladder and biliary tree are normal. Pancreas: Normal. Spleen: Normal. Adrenals/Urinary Tract: Adrenal glands are normal. Kidneys are normal in size without hydronephrosis or nephrolithiasis. Ureters and bladder are normal. Stomach/Bowel: Stomach is normal. Small bowel is normal in caliber and demonstrates minimal wall thickening over several jejunal loops in the left abdomen which is nonspecific. No adjacent free fluid or inflammatory change. Appendix is normal. Colon is normal. Vascular/Lymphatic: Abdominal aorta is normal in caliber. Remaining vascular structures are unremarkable. No significant adenopathy. Reproductive: Uterus normal size with mild prominence of the endometrium which is not well  evaluated by CT. Ovaries are unremarkable. Other: No significant free fluid or focal inflammatory change. Musculoskeletal: No focal abnormality. IMPRESSION: 1. Minimal wall thickening over several jejunal loops in the left abdomen which is nonspecific and may be due to mild enteritis of infectious or inflammatory nature. No adjacent free fluid or inflammatory change. 2. Mild prominence of the endometrium which is not well evaluated by CT. Pelvic ultrasound would be helpful for further evaluation. Electronically Signed   By: Toribio Agreste M.D.   On: 07/21/2024 10:45    Microbiology: Results for orders placed or performed in visit on 07/01/21  Urine Culture     Status: None   Collection Time: 07/01/21 10:48 AM   Specimen: Urine   UR  Result Value Ref Range Status   Urine Culture, Routine Final report  Final   Organism ID, Bacteria Comment  Final    Comment: Culture shows less than 10,000 colony forming units of bacteria per milliliter of urine. This colony count is not generally considered to be clinically significant.     Labs: CBC: Recent Labs  Lab 07/21/24 0915 07/22/24 0221 07/23/24 0511 07/24/24 0430 07/25/24 0529 07/26/24 0453  WBC 17.3* 14.2* 11.8* 9.6 11.9* 8.6  NEUTROABS 14.4* 10.1* 8.5*  --   --   --   HGB 14.9 15.3* 14.6 11.4*  12.6 12.3  HCT 41.9 44.0 42.2 34.1* 37.7 35.6*  MCV 88.0 88.9 89.6 90.7 91.1 90.8  PLT 339 331 333 247 275 265   Basic Metabolic Panel: Recent Labs  Lab 07/23/24 0724 07/24/24 0430 07/25/24 0529 07/26/24 0453 07/27/24 0445 07/28/24 0427  NA  --  135 135 138 136 136  K  --  3.6 3.9 3.4* 3.7 3.6  CL  --  109 102 105 103 104  CO2  --  22 26 27 28 25   GLUCOSE  --  119* 151* 123* 108* 128*  BUN  --  6 7 <5* <5* <5*  CREATININE  --  0.62 0.57 0.68 0.70 0.60  CALCIUM  --  8.0* 8.3* 8.3* 8.2* 8.3*  MG 1.7  --   --   --   --   --   PHOS 2.5  --   --   --   --   --    Liver Function Tests: Recent Labs  Lab 07/22/24 0221 07/23/24 0511  07/26/24 0541 07/27/24 0445 07/28/24 0427  AST 21 17 46* 24 22  ALT 18 17 88* 69* 60*  ALKPHOS 54 51 40 36* 39  BILITOT 1.0 1.2 0.2 0.6 0.6  PROT 6.9 7.0 5.6* 5.3* 5.6*  ALBUMIN 3.7 3.8 3.1* 2.9* 3.0*   CBG: Recent Labs  Lab 07/24/24 0706 07/25/24 0723 07/26/24 0741 07/27/24 0715 07/28/24 0727  GLUCAP 118* 154* 121* 113* 118*    Discharge time spent: greater than 30 minutes.  Signed: Adriana DELENA Grams, MD [facility] 07/28/2024

## 2024-07-29 ENCOUNTER — Emergency Department

## 2024-07-29 ENCOUNTER — Other Ambulatory Visit: Payer: Self-pay

## 2024-07-29 ENCOUNTER — Emergency Department
Admission: EM | Admit: 2024-07-29 | Discharge: 2024-07-29 | Disposition: A | Discharge status: Home / Self Care | Attending: Emergency Medicine | Admitting: Emergency Medicine

## 2024-07-29 ENCOUNTER — Encounter: Payer: Self-pay | Admitting: Emergency Medicine

## 2024-07-29 DIAGNOSIS — G8918 Other acute postprocedural pain: Secondary | ICD-10-CM | POA: Diagnosis present

## 2024-07-29 DIAGNOSIS — N83201 Unspecified ovarian cyst, right side: Secondary | ICD-10-CM

## 2024-07-29 DIAGNOSIS — R109 Unspecified abdominal pain: Secondary | ICD-10-CM | POA: Diagnosis not present

## 2024-07-29 DIAGNOSIS — R102 Pelvic and perineal pain: Secondary | ICD-10-CM

## 2024-07-29 LAB — COMPREHENSIVE METABOLIC PANEL WITH GFR
ALT: 80 U/L — ABNORMAL HIGH (ref 0–44)
AST: 29 U/L (ref 15–41)
Albumin: 4 g/dL (ref 3.5–5.0)
Alkaline Phosphatase: 52 U/L (ref 38–126)
Anion gap: 10 (ref 5–15)
BUN: 5 mg/dL — ABNORMAL LOW (ref 6–20)
CO2: 25 mmol/L (ref 22–32)
Calcium: 9.4 mg/dL (ref 8.9–10.3)
Chloride: 100 mmol/L (ref 98–111)
Creatinine, Ser: 0.64 mg/dL (ref 0.44–1.00)
GFR, Estimated: >60 mL/min (ref >60)
Glucose, Bld: 94 mg/dL (ref 70–99)
Potassium: 3.9 mmol/L (ref 3.5–5.1)
Sodium: 135 mmol/L (ref 135–145)
Total Bilirubin: 0.7 mg/dL (ref 0.0–1.2)
Total Protein: 7.4 g/dL (ref 6.5–8.1)

## 2024-07-29 LAB — URINALYSIS, ROUTINE W REFLEX MICROSCOPIC
Bilirubin Urine: NEGATIVE
Glucose, UA: NEGATIVE mg/dL
Ketones, ur: 20 mg/dL — AB
Leukocytes,Ua: NEGATIVE
Nitrite: NEGATIVE
Protein, ur: NEGATIVE mg/dL
RBC / HPF: >50 RBC/hpf (ref 0–5)
Specific Gravity, Urine: 1.01 (ref 1.005–1.030)
pH: 8 (ref 5.0–8.0)

## 2024-07-29 LAB — CBC
HCT: 40.8 % (ref 36.0–46.0)
Hemoglobin: 14.4 g/dL (ref 12.0–15.0)
MCH: 31.3 pg (ref 26.0–34.0)
MCHC: 35.3 g/dL (ref 30.0–36.0)
MCV: 88.7 fL (ref 80.0–100.0)
Platelets: 282 K/uL (ref 150–400)
RBC: 4.6 MIL/uL (ref 3.87–5.11)
RDW: 12.6 % (ref 11.5–15.5)
WBC: 11.7 K/uL — ABNORMAL HIGH (ref 4.0–10.5)
nRBC: 0 % (ref 0.0–0.2)

## 2024-07-29 LAB — GLIA (IGA/G) + TTG IGA
Antigliadin Abs, IgA: 1 U (ref 0–19)
Gliadin IgG: 2 U (ref 0–19)
Tissue Transglutaminase Ab, IgA: <2 U/mL (ref 0–3)

## 2024-07-29 LAB — SURGICAL PATHOLOGY

## 2024-07-29 LAB — LIPASE, BLOOD: Lipase: 30 U/L (ref 11–51)

## 2024-07-29 LAB — LACTIC ACID, PLASMA: Lactic Acid, Venous: 1.5 mmol/L (ref 0.5–1.9)

## 2024-07-29 LAB — POC URINE PREG, ED: Preg Test, Ur: NEGATIVE

## 2024-07-29 MED ORDER — MORPHINE SULFATE (PF) 4 MG/ML IV SOLN
4.0000 mg | Freq: Once | INTRAVENOUS | Status: AC
  Administered 2024-07-29 (×2): 4 mg via INTRAVENOUS
  Filled 2024-07-29: qty 1

## 2024-07-29 MED ORDER — OXYCODONE-ACETAMINOPHEN 5-325 MG PO TABS
1.0000 | ORAL_TABLET | Freq: Once | ORAL | Status: AC
  Administered 2024-07-29 (×2): 1 via ORAL
  Filled 2024-07-29: qty 1

## 2024-07-29 MED ORDER — IOHEXOL 300 MG/ML  SOLN
100.0000 mL | Freq: Once | INTRAMUSCULAR | Status: AC | PRN
  Administered 2024-07-29 (×2): 100 mL via INTRAVENOUS

## 2024-07-29 MED ORDER — HYDROMORPHONE HCL 1 MG/ML IJ SOLN
1.0000 mg | Freq: Once | INTRAMUSCULAR | Status: AC
  Administered 2024-07-29 (×2): 1 mg via INTRAVENOUS
  Filled 2024-07-29: qty 1

## 2024-07-29 MED ORDER — SODIUM CHLORIDE 0.9 % IV BOLUS
1000.0000 mL | Freq: Once | INTRAVENOUS | Status: AC
  Administered 2024-07-29 (×2): 1000 mL via INTRAVENOUS

## 2024-07-29 MED ORDER — OXYCODONE-ACETAMINOPHEN 5-325 MG PO TABS: 1.0000 | ORAL_TABLET | ORAL | 0 refills | Status: AC | PRN

## 2024-07-29 MED ORDER — ONDANSETRON HCL 4 MG/2ML IJ SOLN
4.0000 mg | Freq: Once | INTRAMUSCULAR | Status: AC
  Administered 2024-07-29 (×2): 4 mg via INTRAVENOUS
  Filled 2024-07-29: qty 2

## 2024-07-29 NOTE — Discharge Instructions (Signed)
 Follow-up with your regular doctor in 2 days.  Return emergency department for worsening

## 2024-07-29 NOTE — ED Triage Notes (Signed)
 Pt reports lower abdominal pain and vomiting. PT reports she had her gallbladder removed 2 days ago. Denies fevers at home.
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
## 2024-08-13 ENCOUNTER — Encounter: Admitting: Surgery

## 2024-08-25 NOTE — Anesthesia Postprocedure Evaluation (Signed)
 Anesthesia Post Note  Patient: Vetra Shinall  Procedure(s) Performed: CHOLECYSTECTOMY, ROBOT-ASSISTED, LAPAROSCOPIC (Abdomen)  Patient location during evaluation: PACU Anesthesia Type: General Level of consciousness: awake and alert Pain management: pain level controlled Vital Signs Assessment: post-procedure vital signs reviewed and stable Respiratory status: spontaneous breathing, nonlabored ventilation, respiratory function stable and patient connected to nasal cannula oxygen Cardiovascular status: blood pressure returned to baseline and stable Postop Assessment: no apparent nausea or vomiting Anesthetic complications: no   No notable events documented.   Last Vitals:  Vitals:   07/28/24 0104 07/28/24 0450  BP:  118/70  Pulse:  (!) 51  Resp:  18  Temp: 37.2 C 37.7 C  SpO2:  97%    Last Pain:  Vitals:   07/28/24 0804  TempSrc:   PainSc: 9                  Andrea Limes

## 2024-09-08 ENCOUNTER — Ambulatory Visit (INDEPENDENT_AMBULATORY_CARE_PROVIDER_SITE_OTHER): Admitting: Gastroenterology

## 2024-09-23 ENCOUNTER — Telehealth: Payer: Self-pay | Admitting: Nurse Practitioner

## 2024-11-06 ENCOUNTER — Telehealth: Payer: Self-pay | Admitting: Nurse Practitioner

## 2024-11-06 NOTE — Progress Notes (Unsigned)
   Subjective:  No chief complaint on file.    HPI: Ashley Soto is a 43 y.o. female presenting on 11/06/2024.  Patient report thing have been very stressful at work.  8 years  [employer]   ROS: Negative unless specifically indicated above in HPI.   Relevant past medical history reviewed and updated as indicated.   Allergies and medications reviewed and updated.   Current Outpatient Medications  Medication Instructions  . amphetamine-dextroamphetamine (ADDERALL) 20 MG tablet 20 mg, Oral, 2 times daily  . Azelaic Acid 15 % gel 1 Application, Topical, Daily  . busPIRone  (BUSPAR ) 15 mg, Oral, 2 times daily  . clindamycin (CLEOCIN T) 1 % lotion 1 Application, Topical, Daily  . dicyclomine  (BENTYL ) 20 mg, Oral, 2 times daily  . docusate sodium  (COLACE) 100 mg, Oral, 2 times daily  . etonogestrel  (NEXPLANON ) 68 mg, Subdermal,  Once  . ferrous sulfate  325 mg, Oral, Daily with breakfast  . FLUoxetine  (PROZAC ) 40 mg, Oral, Every morning  . ondansetron  (ZOFRAN -ODT) 4 mg, Oral, Every 8 hours PRN  . oxyCODONE  (ROXICODONE ) 5 mg, Oral, Every 6 hours PRN  . oxyCODONE -acetaminophen  (PERCOCET) 5-325 MG tablet 1 tablet, Oral, Every 4 hours PRN  . traZODone  (DESYREL ) 50-100 mg, Daily at bedtime  . tretinoin (RETIN-A) 0.025 % cream Topical     Allergies  Allergen Reactions  . Paroxetine Hcl     Suicidal thoughts   . Sulfa Antibiotics Hives    Objective:   There were no vitals taken for this visit.   Physical Exam Constitutional:      Appearance: Normal appearance.  HENT:     Head: Normocephalic.  Eyes:     Conjunctiva/sclera: Conjunctivae normal.  Cardiovascular:     Rate and Rhythm: Normal rate and regular rhythm.  Pulmonary:     Effort: Pulmonary effort is normal.     Breath sounds: Normal breath sounds.  Skin:    General: Skin is warm and dry.  Neurological:     General: No focal deficit present.     Mental Status: She is alert and oriented to person, place, and  time.  Psychiatric:        Mood and Affect: Mood normal.        Behavior: Behavior normal.        Thought Content: Thought content normal.        Judgment: Judgment normal.     Eye Contact:  {BHH EYE CONTACT:22684}  Speech:  {Speech:22685}  Volume:  {Volume (PAA):22686}  Mood:  {BHH MOOD:22306}  Affect:  {Affect (PAA):22687}  Thought Process:  {Thought Process (PAA):22688}  Orientation:  {BHH ORIENTATION (PAA):22689}  Thought Content:  {Thought Content:22690}  Suicidal Thoughts:  {ST/HT (PAA):22692}  Homicidal Thoughts:  {ST/HT (PAA):22692}  Memory:  {BHH MEMORY:22881}  Judgement:  {Judgement (PAA):22694}  Insight:  {Insight (PAA):22695}  Psychomotor Activity:  {Psychomotor (PAA):22696}  Concentration:  {Concentration:21399}  Recall:  {BHH GOOD/FAIR/POOR:22877}  Fund of Knowledge:  {BHH GOOD/FAIR/POOR:22877}  Language:  {BHH GOOD/FAIR/POOR:22877}  Akathisia:  {BHH YES OR NO:22294}  Handed:  {Handed:22697}  AIMS (if indicated):     Assets:  {Assets (PAA):22698}  ADL's:  {BHH JIO'D:77709}  Cognition:  {chl bhh cognition:304700322}  Sleep:        Assessment & Plan:   Assessment & Plan     Follow up plan: No follow-ups on file.  Florencia Cousin, NP

## 2025-02-03 ENCOUNTER — Telehealth: Payer: Self-pay | Admitting: Nurse Practitioner
